# Patient Record
Sex: Male | Born: 2000
Health system: Southern US, Community
[De-identification: ages and names within clinical notes are randomized; demographics above are authoritative.]

## PROBLEM LIST (undated history)

## (undated) DIAGNOSIS — F909 Attention-deficit hyperactivity disorder, unspecified type: Secondary | ICD-10-CM

---
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
## 2018-01-20 ENCOUNTER — Other Ambulatory Visit: Payer: Self-pay | Admitting: Gerontology

## 2018-01-20 ENCOUNTER — Ambulatory Visit (INDEPENDENT_AMBULATORY_CARE_PROVIDER_SITE_OTHER): Payer: Self-pay

## 2018-01-20 DIAGNOSIS — Z0000 Encounter for general adult medical examination without abnormal findings: Secondary | ICD-10-CM

## 2018-02-11 ENCOUNTER — Other Ambulatory Visit: Payer: Self-pay | Admitting: Family Medicine

## 2018-02-11 DIAGNOSIS — R1011 Right upper quadrant pain: Secondary | ICD-10-CM

## 2018-02-11 DIAGNOSIS — R945 Abnormal results of liver function studies: Secondary | ICD-10-CM

## 2018-02-11 DIAGNOSIS — R7989 Other specified abnormal findings of blood chemistry: Secondary | ICD-10-CM

## 2018-02-16 ENCOUNTER — Ambulatory Visit
Admission: RE | Admit: 2018-02-16 | Discharge: 2018-02-16 | Disposition: A | Discharge status: Home / Self Care | Payer: 59 | Source: Ambulatory Visit | Attending: Family Medicine | Admitting: Family Medicine

## 2018-02-16 DIAGNOSIS — R945 Abnormal results of liver function studies: Secondary | ICD-10-CM

## 2018-02-16 DIAGNOSIS — R1011 Right upper quadrant pain: Secondary | ICD-10-CM

## 2018-02-16 DIAGNOSIS — R7989 Other specified abnormal findings of blood chemistry: Secondary | ICD-10-CM

## 2018-03-15 ENCOUNTER — Other Ambulatory Visit: Payer: Self-pay | Admitting: Physician Assistant

## 2018-03-15 DIAGNOSIS — R7989 Other specified abnormal findings of blood chemistry: Secondary | ICD-10-CM

## 2018-03-15 DIAGNOSIS — R945 Abnormal results of liver function studies: Principal | ICD-10-CM

## 2018-03-15 DIAGNOSIS — R9389 Abnormal findings on diagnostic imaging of other specified body structures: Secondary | ICD-10-CM

## 2018-03-21 ENCOUNTER — Ambulatory Visit
Admission: RE | Admit: 2018-03-21 | Discharge: 2018-03-21 | Disposition: A | Discharge status: Home / Self Care | Payer: 59 | Source: Ambulatory Visit | Attending: Physician Assistant | Admitting: Physician Assistant

## 2018-03-21 DIAGNOSIS — R945 Abnormal results of liver function studies: Principal | ICD-10-CM

## 2018-03-21 DIAGNOSIS — R7989 Other specified abnormal findings of blood chemistry: Secondary | ICD-10-CM

## 2018-03-21 DIAGNOSIS — R9389 Abnormal findings on diagnostic imaging of other specified body structures: Secondary | ICD-10-CM

## 2018-03-21 MED ORDER — IOPAMIDOL (ISOVUE-300) INJECTION 61%
100.0000 mL | Freq: Once | INTRAVENOUS | Status: AC | PRN
  Administered 2018-03-21: 100 mL via INTRAVENOUS

## 2018-09-16 ENCOUNTER — Emergency Department (HOSPITAL_COMMUNITY)
Admission: EM | Admit: 2018-09-16 | Discharge: 2018-09-17 | Disposition: A | Discharge status: Home / Self Care | Payer: 59 | Attending: Emergency Medicine | Admitting: Emergency Medicine

## 2018-09-16 ENCOUNTER — Encounter (HOSPITAL_COMMUNITY): Payer: Self-pay | Admitting: Emergency Medicine

## 2018-09-16 DIAGNOSIS — F909 Attention-deficit hyperactivity disorder, unspecified type: Secondary | ICD-10-CM | POA: Diagnosis not present

## 2018-09-16 DIAGNOSIS — Z87891 Personal history of nicotine dependence: Secondary | ICD-10-CM | POA: Diagnosis not present

## 2018-09-16 DIAGNOSIS — R109 Unspecified abdominal pain: Secondary | ICD-10-CM | POA: Diagnosis present

## 2018-09-16 DIAGNOSIS — K625 Hemorrhage of anus and rectum: Secondary | ICD-10-CM | POA: Diagnosis not present

## 2018-09-16 LAB — CBC
HCT: 43.5 % (ref 39.0–52.0)
Hemoglobin: 14.7 g/dL (ref 13.0–17.0)
MCH: 30.5 pg (ref 26.0–34.0)
MCHC: 33.8 g/dL (ref 30.0–36.0)
MCV: 90.2 fL (ref 80.0–100.0)
Platelets: 327 10*3/uL (ref 150–400)
RBC: 4.82 MIL/uL (ref 4.22–5.81)
RDW: 12.2 % (ref 11.5–15.5)
WBC: 8 10*3/uL (ref 4.0–10.5)
nRBC: 0 % (ref 0.0–0.2)

## 2018-09-16 LAB — COMPREHENSIVE METABOLIC PANEL
ALT: 19 U/L (ref 0–44)
AST: 20 U/L (ref 15–41)
Albumin: 4.7 g/dL (ref 3.5–5.0)
Alkaline Phosphatase: 98 U/L (ref 38–126)
Anion gap: 12 (ref 5–15)
BUN: 11 mg/dL (ref 6–20)
CO2: 24 mmol/L (ref 22–32)
Calcium: 9.4 mg/dL (ref 8.9–10.3)
Chloride: 100 mmol/L (ref 98–111)
Creatinine, Ser: 1.02 mg/dL (ref 0.61–1.24)
GFR calc Af Amer: >60 mL/min (ref >60)
GFR calc non Af Amer: >60 mL/min (ref >60)
Glucose, Bld: 96 mg/dL (ref 70–99)
Potassium: 3.4 mmol/L — ABNORMAL LOW (ref 3.5–5.1)
Sodium: 136 mmol/L (ref 135–145)
Total Bilirubin: 1 mg/dL (ref 0.3–1.2)
Total Protein: 7.2 g/dL (ref 6.5–8.1)

## 2018-09-16 LAB — TYPE AND SCREEN
ABO/RH(D): O POS
Antibody Screen: NEGATIVE

## 2018-09-16 LAB — ABO/RH: ABO/RH(D): O POS

## 2018-09-16 NOTE — ED Notes (Signed)
Pt in POV, reports rectal bleeding since yesterday with gen abd pain. No hx of same.

## 2018-09-17 ENCOUNTER — Emergency Department (HOSPITAL_COMMUNITY): Payer: 59

## 2018-09-17 ENCOUNTER — Other Ambulatory Visit: Payer: Self-pay

## 2018-09-17 MED ORDER — IOHEXOL 300 MG/ML  SOLN
100.0000 mL | Freq: Once | INTRAMUSCULAR | Status: AC | PRN
  Administered 2018-09-17: 06:00:00 100 mL via INTRAVENOUS

## 2018-09-17 NOTE — ED Provider Notes (Signed)
Emergency Department Provider Note   I have reviewed the triage vital signs and the nursing notes.   HISTORY  Chief Complaint Rectal Bleeding   HPI Russell Walker is a 18 y.o. male without significant past medical history who presents the emergency department today with abdominal pain and bloody stools.  Patient states that he has some type of liver abnormality they have been working up at [facility] but could not find a cause so told to go to the hospital.  Patient is not as worried about that but states that for the last 3 or 4 days he has had diffuse abdominal pain with dark blood and bright red blood in his stools multiple times.  States that seems to be in the feces itself and does not seem to be on the outside.  Has never anything like this before.  No known hemorrhoids.  No fevers.  No other associated or modifying symptoms.    Past Medical History:  Diagnosis Date   ADHD     There are no active problems to display for this patient.   History reviewed. No pertinent surgical history.    Allergies Patient has no known allergies.  No family history on file.  Social History Social History   Tobacco Use   Smoking status: Former Smoker   Smokeless tobacco: Never Used  Substance Use Topics   Alcohol use: Not Currently   Drug use: Yes    Types: Marijuana    Review of Systems  All other systems negative except as documented in the HPI. All pertinent positives and negatives as reviewed in the HPI. ____________________________________________   PHYSICAL EXAM:  VITAL SIGNS: ED Triage Vitals  Enc Vitals Group     BP 09/16/18 2214 129/66     Pulse Rate 09/16/18 2214 97     Resp 09/16/18 2214 18     Temp 09/16/18 2214 97.7 F (36.5 C)     Temp Source 09/16/18 2214 Oral     SpO2 09/16/18 2214 97 %     Weight 09/16/18 2214 170 lb (77.1 kg)     Height 09/16/18 2214 6' (1.829 m)    Constitutional: Alert and oriented. Well appearing and in no acute  distress. Eyes: Conjunctivae are normal. PERRL. EOMI. Head: Atraumatic. Nose: No congestion/rhinnorhea. Mouth/Throat: Mucous membranes are moist.  Oropharynx non-erythematous. Neck: No stridor.  No meningeal signs.   Cardiovascular: Normal rate, regular rhythm. Good peripheral circulation. Grossly normal heart sounds.   Respiratory: Normal respiratory effort.  No retractions. Lungs CTAB. Gastrointestinal: Soft and nontender. No distention.  Musculoskeletal: No lower extremity tenderness nor edema. No gross deformities of extremities. Neurologic:  Normal speech and language. No gross focal neurologic deficits are appreciated.  Skin:  Skin is warm, dry and intact. No rash noted.   ____________________________________________   LABS (all labs ordered are listed, but only abnormal results are displayed)  Labs Reviewed  COMPREHENSIVE METABOLIC PANEL - Abnormal; Notable for the following components:      Result Value   Potassium 3.4 (*)    All other components within normal limits  CBC  POC OCCULT BLOOD, ED  TYPE AND SCREEN  ABO/RH   ____________________________________________  EKG   EKG Interpretation  Date/Time:    Ventricular Rate:    PR Interval:    QRS Duration:   QT Interval:    QTC Calculation:   R Axis:     Text Interpretation:         ____________________________________________  RADIOLOGY  Ct Abdomen Pelvis W Contrast  Result Date: 09/17/2018 CLINICAL DATA:  18 year old male with history of persistent abdominal pain for the past 10 months. Bloody stools for the past 3 days. EXAM: CT ABDOMEN AND PELVIS WITH CONTRAST TECHNIQUE: Multidetector CT imaging of the abdomen and pelvis was performed using the standard protocol following bolus administration of intravenous contrast. CONTRAST:  100mL OMNIPAQUE IOHEXOL 300 MG/ML  SOLN COMPARISON:  CT the abdomen and pelvis 03/21/2018. FINDINGS: Lower chest: Unremarkable. Hepatobiliary: No suspicious cystic or solid  hepatic lesions. No intra or extrahepatic biliary ductal dilatation. Gallbladder is normal in appearance. Pancreas: No pancreatic mass. No pancreatic ductal dilatation. No pancreatic or peripancreatic fluid or inflammatory changes. Spleen: Unremarkable. Adrenals/Urinary Tract: Right kidney is not visualized, presumably congenitally absent. Left kidney and bilateral adrenal glands are normal in appearance. No hydroureteronephrosis. Urinary bladder is normal in appearance. Stomach/Bowel: Normal appearance of the stomach. No pathologic dilatation of small bowel or colon. The appendix is not confidently identified and may be surgically absent. Regardless, there are no inflammatory changes noted adjacent to the cecum to suggest the presence of an acute appendicitis at this time. Vascular/Lymphatic: No significant atherosclerotic disease, aneurysm or dissection noted in the abdominal or pelvic vasculature. Left renal vein again appears mildly compressed between the superior mesenteric artery and the abdominal aorta, associated with mild dilatation of the proximal left renal vein, as well as the presence of venous collaterals extending into the left lumbar region, similar to the prior examination. No lymphadenopathy noted in the abdomen or pelvis. Reproductive: Prostate gland and seminal vesicles are unremarkable in appearance. Other: No significant volume of ascites.  No pneumoperitoneum. Musculoskeletal: There are no aggressive appearing lytic or blastic lesions noted in the visualized portions of the skeleton. IMPRESSION: 1. No acute findings are noted in the abdomen or pelvis to account for the patient's symptoms. 2. Congenital absence of the right kidney. Solitary left kidney, with findings again suggestive of Nutcracker syndrome, as detailed above. Electronically Signed   By: Trudie Reedaniel  Entrikin M.D.   On: 09/17/2018 07:00    ____________________________________________   PROCEDURES  Procedure(s) performed:    Procedures   ____________________________________________   INITIAL IMPRESSION / ASSESSMENT AND PLAN / ED COURSE  Will eval for IBD. GI follow up if negative.    Workup negative. Stable VS. Normal Hb, no indication for admission or further workup at this time.  Pertinent labs & imaging results that were available during my care of the patient were reviewed by me and considered in my medical decision making (see chart for details).   A medical screening exam was performed and I feel the patient has had an appropriate workup for their chief complaint at this time and likelihood of emergent condition existing is low. They have been counseled on decision, discharge, follow up and which symptoms necessitate immediate return to the emergency department. They or their family verbally stated understanding and agreement with plan and discharged in stable condition.   ____________________________________________  FINAL CLINICAL IMPRESSION(S) / ED DIAGNOSES  Final diagnoses:  Rectal bleeding     MEDICATIONS GIVEN DURING THIS VISIT:  Medications  iohexol (OMNIPAQUE) 300 MG/ML solution 100 mL (100 mLs Intravenous Contrast Given 09/17/18 0617)     NEW OUTPATIENT MEDICATIONS STARTED DURING THIS VISIT:  New Prescriptions   No medications on file    Note:  This note was prepared with assistance of Dragon voice recognition software. Occasional wrong-word or sound-a-like substitutions may have occurred due to the inherent limitations of voice  recognition software.   Jennica Tagliaferri, Barbara Cower, MD 09/17/18 [phone]

## 2018-09-17 NOTE — ED Notes (Signed)
Patient transported to CT 

## 2018-12-21 IMAGING — US US ABDOMEN COMPLETE
1 series · 13 of 25 positions shown · non-contrast
Comparison: CT 10/29/2017.

CLINICAL DATA: Right upper quadrant and mid abdominal pain.
Prominent liver. Elevated LFTs. Recent falls. Solitary left kidney.

EXAM:
ABDOMEN ULTRASOUND COMPLETE

[Series 1: us abdomen complete · 0.14mm/px · 13 of 76 slices shown]
[im 1/76]
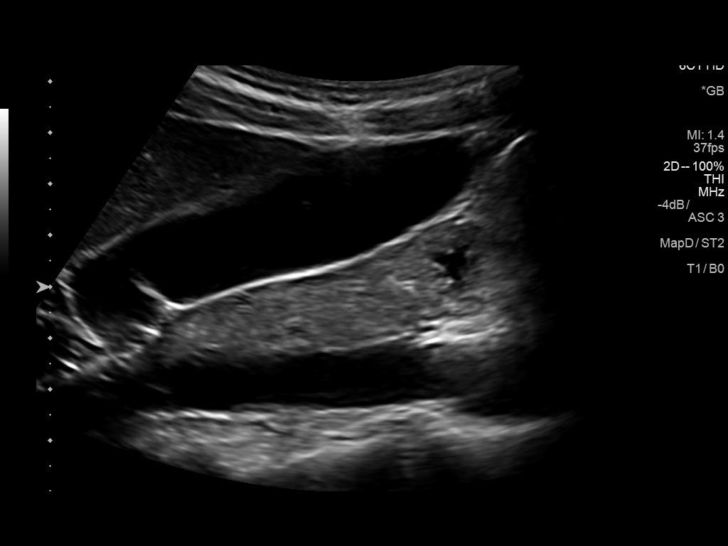
[im 7/76]
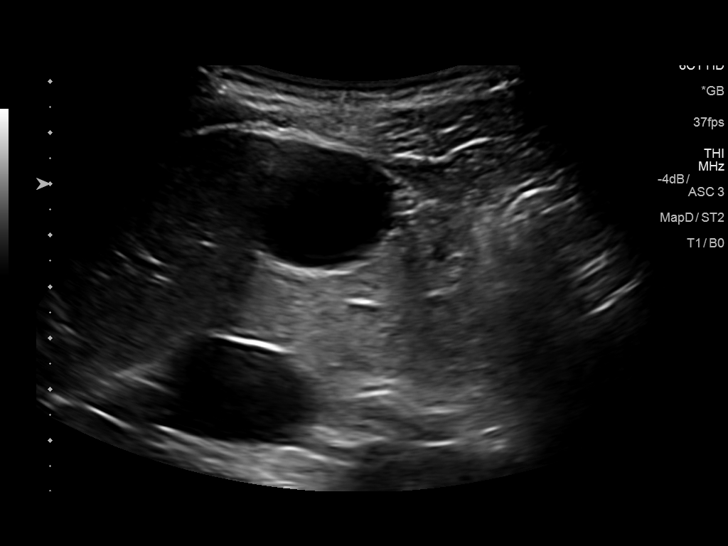
[im 13/76]
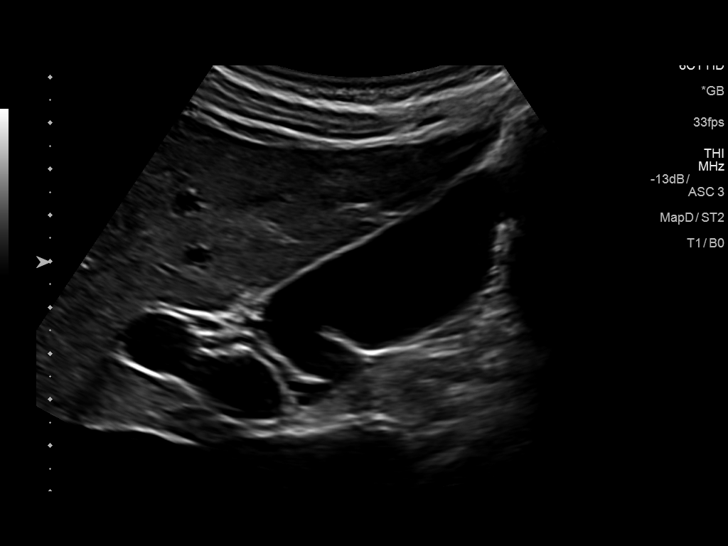
[im 19/76]
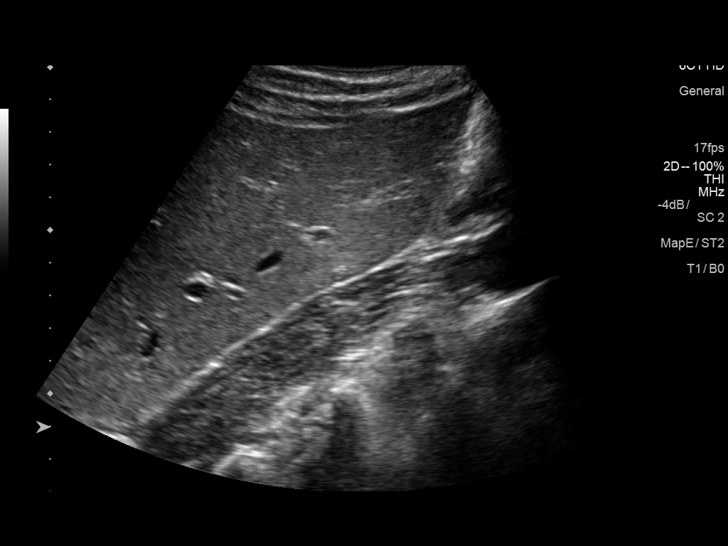
[im 26/76]
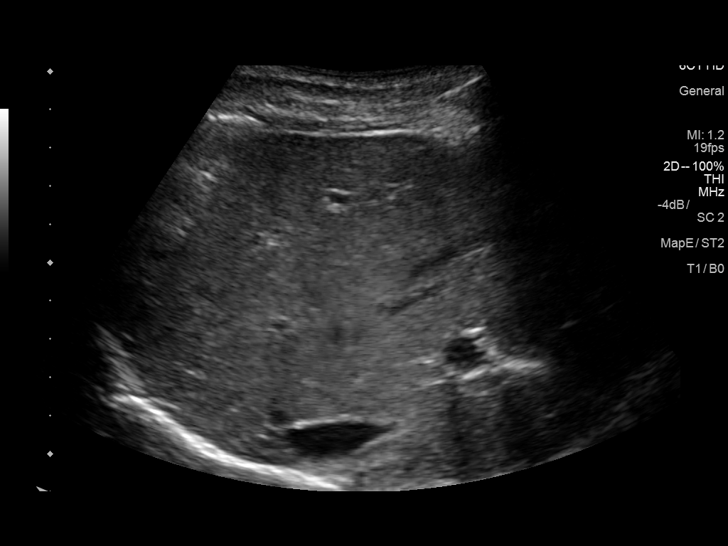
[im 32/76]
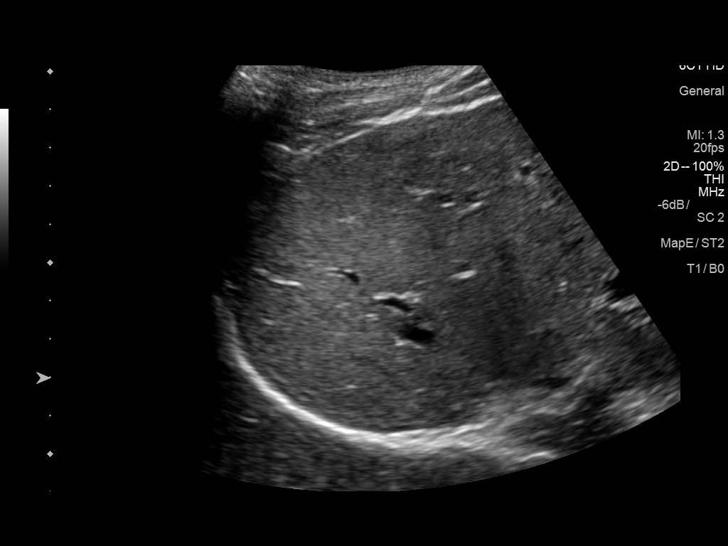
[im 38/76]
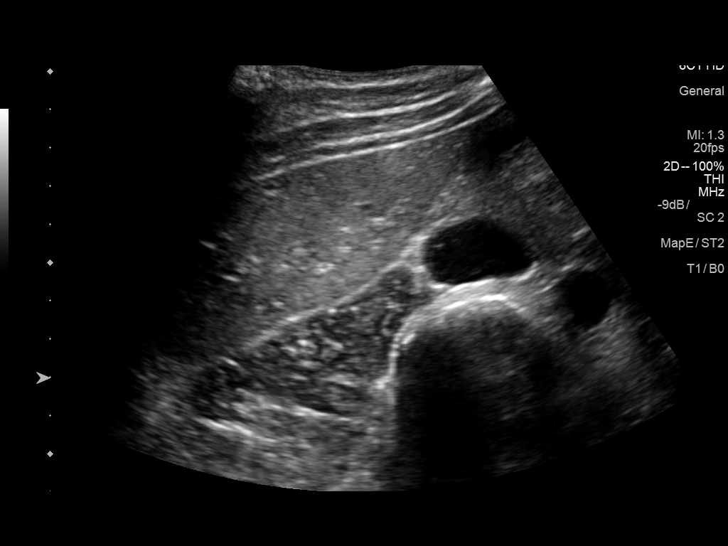
[im 44/76]
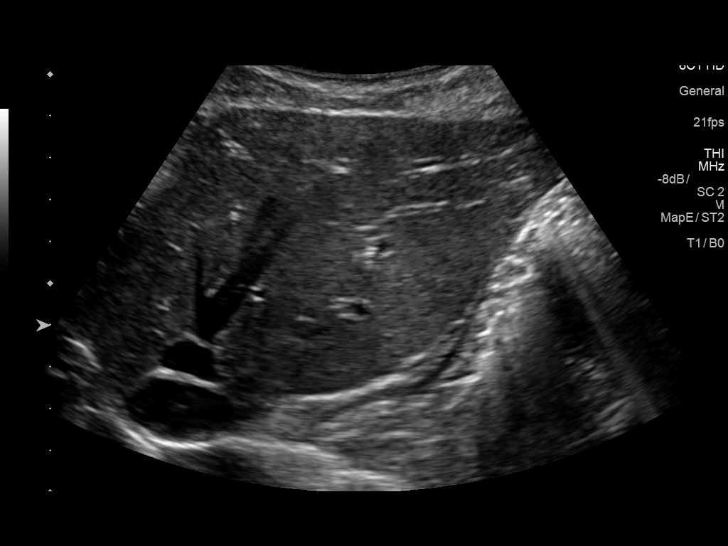
[im 51/76]
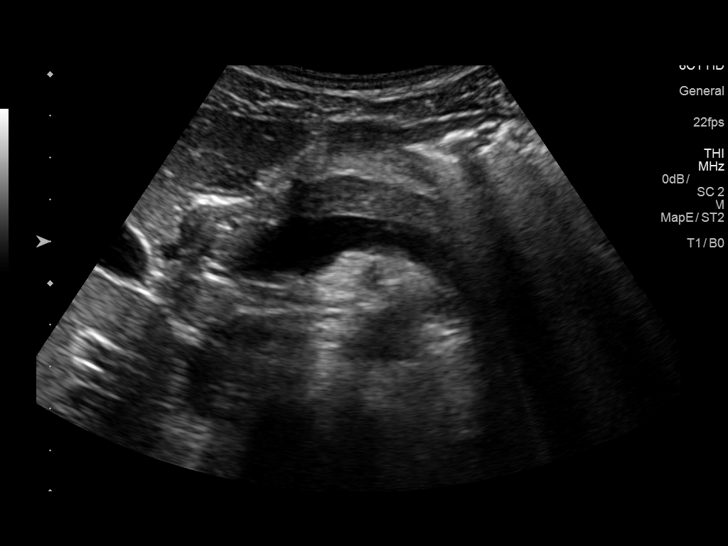
[im 57/76]
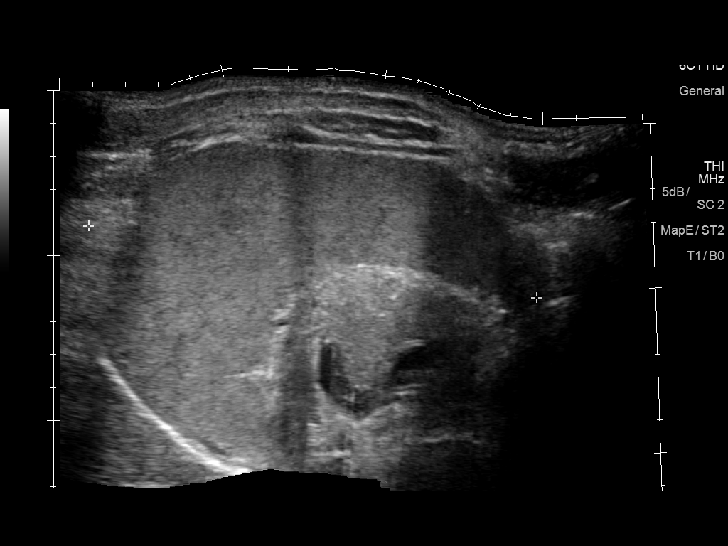
[im 63/76]
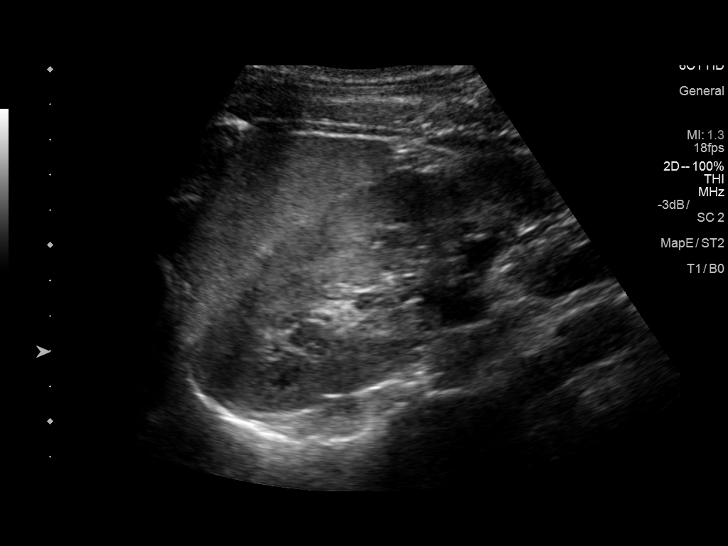
[im 69/76]
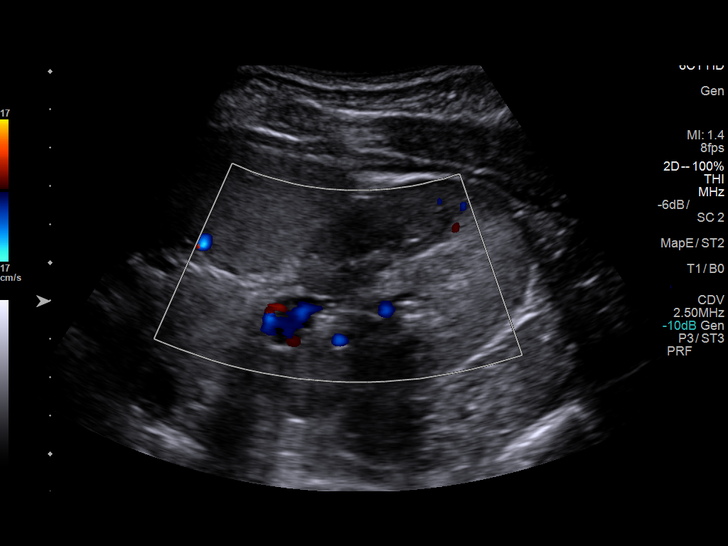
[im 76/76]
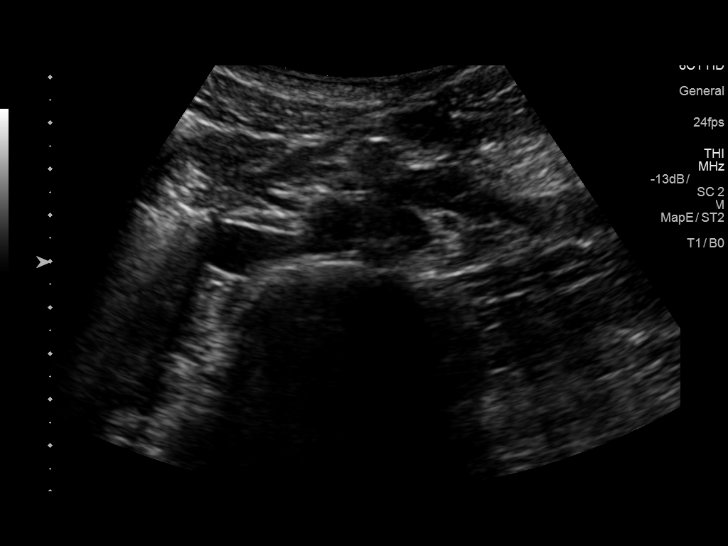

[13 of 25 positions shown; findings below may reference images not displayed]

FINDINGS: Gallbladder: No gallstones or wall thickening visualized. No
sonographic Murphy sign noted by sonographer.

Common bile duct: Diameter: 2.5 mm

Liver: Liver is prominent and measures 16.0 cm. Hepatic echotexture
is normal. No focal hepatic abnormality identified. No portal vein
is patent on color Doppler imaging with normal direction of blood
flow towards the liver.

IVC: No abnormality visualized.

Pancreas: Visualized portion unremarkable.

Spleen: Spleen measures 13 scratched it spleen is prominent at
cm with a volume of 537 cc. No splenic mass noted.

Right Kidney: Length: Absent right kidney.

Left Kidney: Length: 14.4 cm. Echogenicity within normal limits. No
mass or hydronephrosis visualized.

Abdominal aorta: No aneurysm visualized.

Other findings: None.
IMPRESSION: 1. Liver is prominent at 16.0 cm. Hepatic echotexture is normal. No
focal hepatic mass. No gallstones or biliary distention.

2.  Spleen is prominent at 13.5 cm with a volume of 537 cc.

3.  Solitary left kidney again noted.

## 2019-05-23 ENCOUNTER — Emergency Department (HOSPITAL_COMMUNITY)
Admission: EM | Admit: 2019-05-23 | Discharge: 2019-05-23 | Disposition: A | Discharge status: Home / Self Care | Payer: 59 | Attending: Emergency Medicine | Admitting: Emergency Medicine

## 2019-05-23 ENCOUNTER — Telehealth (HOSPITAL_COMMUNITY): Payer: Self-pay | Admitting: Professional

## 2019-05-23 ENCOUNTER — Encounter (HOSPITAL_COMMUNITY): Payer: Self-pay

## 2019-05-23 ENCOUNTER — Other Ambulatory Visit: Payer: Self-pay

## 2019-05-23 DIAGNOSIS — F322 Major depressive disorder, single episode, severe without psychotic features: Secondary | ICD-10-CM | POA: Insufficient documentation

## 2019-05-23 DIAGNOSIS — T50904A Poisoning by unspecified drugs, medicaments and biological substances, undetermined, initial encounter: Secondary | ICD-10-CM

## 2019-05-23 DIAGNOSIS — R45851 Suicidal ideations: Secondary | ICD-10-CM | POA: Insufficient documentation

## 2019-05-23 DIAGNOSIS — F341 Dysthymic disorder: Secondary | ICD-10-CM | POA: Diagnosis present

## 2019-05-23 DIAGNOSIS — T38894A Poisoning by other hormones and synthetic substitutes, undetermined, initial encounter: Secondary | ICD-10-CM | POA: Diagnosis present

## 2019-05-23 DIAGNOSIS — F339 Major depressive disorder, recurrent, unspecified: Secondary | ICD-10-CM

## 2019-05-23 DIAGNOSIS — Z87891 Personal history of nicotine dependence: Secondary | ICD-10-CM | POA: Diagnosis not present

## 2019-05-23 LAB — COMPREHENSIVE METABOLIC PANEL
ALT: 13 U/L (ref 0–44)
AST: 14 U/L — ABNORMAL LOW (ref 15–41)
Albumin: 4.8 g/dL (ref 3.5–5.0)
Alkaline Phosphatase: 67 U/L (ref 38–126)
Anion gap: 9 (ref 5–15)
BUN: 12 mg/dL (ref 6–20)
CO2: 28 mmol/L (ref 22–32)
Calcium: 9.4 mg/dL (ref 8.9–10.3)
Chloride: 105 mmol/L (ref 98–111)
Creatinine, Ser: 1.02 mg/dL (ref 0.61–1.24)
GFR calc Af Amer: >60 mL/min (ref >60)
GFR calc non Af Amer: >60 mL/min (ref >60)
Glucose, Bld: 119 mg/dL — ABNORMAL HIGH (ref 70–99)
Potassium: 3.5 mmol/L (ref 3.5–5.1)
Sodium: 142 mmol/L (ref 135–145)
Total Bilirubin: 0.7 mg/dL (ref 0.3–1.2)
Total Protein: 7.2 g/dL (ref 6.5–8.1)

## 2019-05-23 LAB — RAPID URINE DRUG SCREEN, HOSP PERFORMED
Amphetamines: NOT DETECTED
Barbiturates: NOT DETECTED
Benzodiazepines: NOT DETECTED
Cocaine: NOT DETECTED
Opiates: NOT DETECTED
Tetrahydrocannabinol: NOT DETECTED

## 2019-05-23 LAB — CBC
HCT: 43.4 % (ref 39.0–52.0)
Hemoglobin: 14.2 g/dL (ref 13.0–17.0)
MCH: 30 pg (ref 26.0–34.0)
MCHC: 32.7 g/dL (ref 30.0–36.0)
MCV: 91.6 fL (ref 80.0–100.0)
Platelets: 329 10*3/uL (ref 150–400)
RBC: 4.74 MIL/uL (ref 4.22–5.81)
RDW: 11.9 % (ref 11.5–15.5)
WBC: 6.4 10*3/uL (ref 4.0–10.5)
nRBC: 0 % (ref 0.0–0.2)

## 2019-05-23 LAB — ETHANOL: Alcohol, Ethyl (B): <10 mg/dL (ref <10)

## 2019-05-23 LAB — ACETAMINOPHEN LEVEL: Acetaminophen (Tylenol), Serum: <10 ug/mL — ABNORMAL LOW (ref 10–30)

## 2019-05-23 LAB — SALICYLATE LEVEL: Salicylate Lvl: <7 mg/dL — ABNORMAL LOW (ref 7.0–30.0)
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
## 2019-05-23 NOTE — [facility] Suicide Risk Assessment (Cosign Needed)
Suicide Risk Assessment  Discharge Assessment   [facility] Discharge Suicide Risk Assessment   Principal Problem: Dysthymia Discharge Diagnoses: Principal Problem:   Dysthymia   Total Time spent with patient: 30 minutes  Musculoskeletal: Strength & Muscle Tone: within normal limits Gait & Station: normal Patient leans: N/A  Psychiatric Specialty Exam:   Blood pressure 125/88, pulse 75, temperature 98.2 F (36.8 C), temperature source Oral, resp. rate 18, height 6' (1.829 m), weight 70.3 kg, SpO2 98 %.Body mass index is 21.02 kg/m.  General Appearance: Casual  Eye Contact::  Good  Speech:  Clear and Coherent and Normal Rate409  Volume:  Normal  Mood:  "good.  Only bad mood when mother is around.  She is toxic"    Affect:  Appropriate and Congruent  Thought Process:  Coherent, Goal Directed and Descriptions of Associations: Intact  Orientation:  Full (Time, Place, and Person)  Thought Content:  WDL  Suicidal Thoughts:  No  Homicidal Thoughts:  No  Memory:  Immediate;   Good Recent;   Good Remote;   Good  Judgement:  Intact  Insight:  Present  Psychomotor Activity:  Normal  Concentration:  Good  Recall:  Good  Fund of Knowledge:Good  Language: Good  Akathisia:  No  Handed:  Right  AIMS (if indicated):     Assets:  Communication Skills Desire for Improvement Housing Physical Health Social Support  Sleep:     Cognition: WNL  ADL's:  Intact   Mental Status Per Nursing Assessment::   On Admission:     Russell Walker, 19 y.o., male patient seen via tele psych by this provider, Dr. Lucianne Muss; and chart reviewed on 05/23/19.  On evaluation Russell Walker reports he is only in the hospital because he was IVC by his mother.  Patient chronic depression and has had suicidal thoughts but would not try to kill himself.  Patient states that he takes melatonin 20 to 50 mg every night "I had taken some earlier and couldn't fall to sleep so I had taken more.  I was sleep when the police  came."  Patient denies suicidal/self-harm/homicidal ideation, psychosis, and paranoia.   During evaluation Russell Walker is alert/oriented x 4; calm/cooperative; and mood is congruent with affect.  He does not appear to be responding to internal/external stimuli or delusional thoughts.  Patient denies suicidal/self-harm/homicidal ideation, psychosis, and paranoia.  Patient answered question appropriately.  Collateral information gathered from mother of patient (see TTS assessment note)  Feels that patient safe to come home but would like him to have outpatient psychiatric services    Demographic Factors:  Male and Caucasian  Loss Factors: Financial problems/change in socioeconomic status  Historical Factors: NA  Risk Reduction Factors:   Religious beliefs about death, Living with another person, especially a relative and Positive social support  Continued Clinical Symptoms:  Dysthymia  Cognitive Features That Contribute To Risk:  None    Suicide Risk:  Minimal: No identifiable suicidal ideation.  Patients presenting with no risk factors but with morbid ruminations; may be classified as minimal risk based on the severity of the depressive symptoms    Plan Of Care/Follow-up recommendations:  Activity:  As tolerated Diet:  Heart healthy      Discharge Instructions     For your behavioral health needs you are advised to follow up with an outpatient psychiatrist.  Contact one of the following providers at your earliest opportunity to ask about scheduling an intake appointment:  [facility] [place], [place] [address]      [phone]       [address] [place], [place] [address]      [phone]       Leanord Hawking, MD      [facility]      [address], [place] [address]      [phone]     Disposition:  Patient psychiatrically cleared No evidence of imminent risk to self or others at present.   Patient does not meet criteria for psychiatric inpatient admission. Supportive therapy provided about ongoing stressors. Discussed crisis plan, support from social network, calling 911, coming to the Emergency Department, and calling Suicide Hotline.  Russell Train, NP 05/23/2019, 10:18 AM

## 2019-05-23 NOTE — Discharge Instructions (Signed)
For your behavioral health needs you are advised to follow up with an outpatient psychiatrist.  Contact one of the following providers at your earliest opportunity to ask about scheduling an intake appointment:       [facility]      [address]      [place], [place] [address]      [phone]       [facility]      [address]      [place], [place] [address]      [phone]       Neila Gear, MD      [facility]      [address]      [place], [place] [address]      [phone]
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
## 2019-05-23 NOTE — ED Triage Notes (Signed)
Patient arrived stating he took 100mg  of melatonin. States someone called for a wellness check due to daily suicidal thoughts over the last few years. Reports HI in the past but SI today. Plan to overdose off sleeping pills so he could go to sleep and not wake back up.

## 2019-05-23 NOTE — ED Provider Notes (Signed)
19 year old male history of depression and suicidal presents today stating that he felt suicidal last night due to interactions an argument with his mother.  He took 2 handfuls of thiamine.  He denies any other ingestion or suicidal gestures.  He reports that he called the suicide hotline.  He is here because he hung up on them and the sheriff showed up at his door and brought him to the ED. Here he reports some depression but is not currently stating that he is suicidal.  He was evaluated for his overdose and poison control was contacted.  They advise EKG.  EKG appears normal.  Remainder of his labs appear normal.  Appears stable for psychiatric disposition.  He is asymptomatic for Covid and Covid test is currently pending. Vitals:   05/23/19 0800 05/23/19 0830  BP: 125/73 110/64  Pulse: 79 71  Resp: 13 16  Temp:    SpO2: 99% 99%     Well-developed well-nourished male single does not appear to be in any acute distress Heart is regular rate and rhythm Lungs are clear to auscultation Neurologically he is alert and oriented.  No focal deficits.     Margarita Grizzle, MD 05/23/19 [phone]

## 2019-05-23 NOTE — BH Assessment (Signed)
[facility] Assessment Progress Note  Per Shuvon Rankin, FNP, this pt does not require psychiatric hospitalization at this time.  Pt is to be discharged from [facility] with referral information for area psychiatrists.  This has been included in pt's discharge instructions.  Pt's nurse, Diane, has been notified.  Doylene Canning, MA Triage Specialist [phone]

## 2019-05-23 NOTE — ED Notes (Signed)
PT DISCHARGED. INSTRUCTIONS GIVEN. AAOX4. PT IN NO APPARENT DISTRESS OR PAIN. THE OPPORTUNITY TO ASK QUESTIONS WAS PROVIDED. 

## 2019-05-23 NOTE — ED Notes (Signed)
Pt advised the need for a urine sample.

## 2019-05-24 ENCOUNTER — Telehealth (HOSPITAL_COMMUNITY): Payer: Self-pay | Admitting: Professional

## 2020-05-10 IMAGING — DX DG CHEST 2V
2 series · 2 of 2 positions shown · non-contrast
Comparison: Chest CT October 29, 2017; chest radiograph October 29, 2017

CLINICAL DATA: Employer annual screening

EXAM:
CHEST - 2 VIEW

[chest pa]
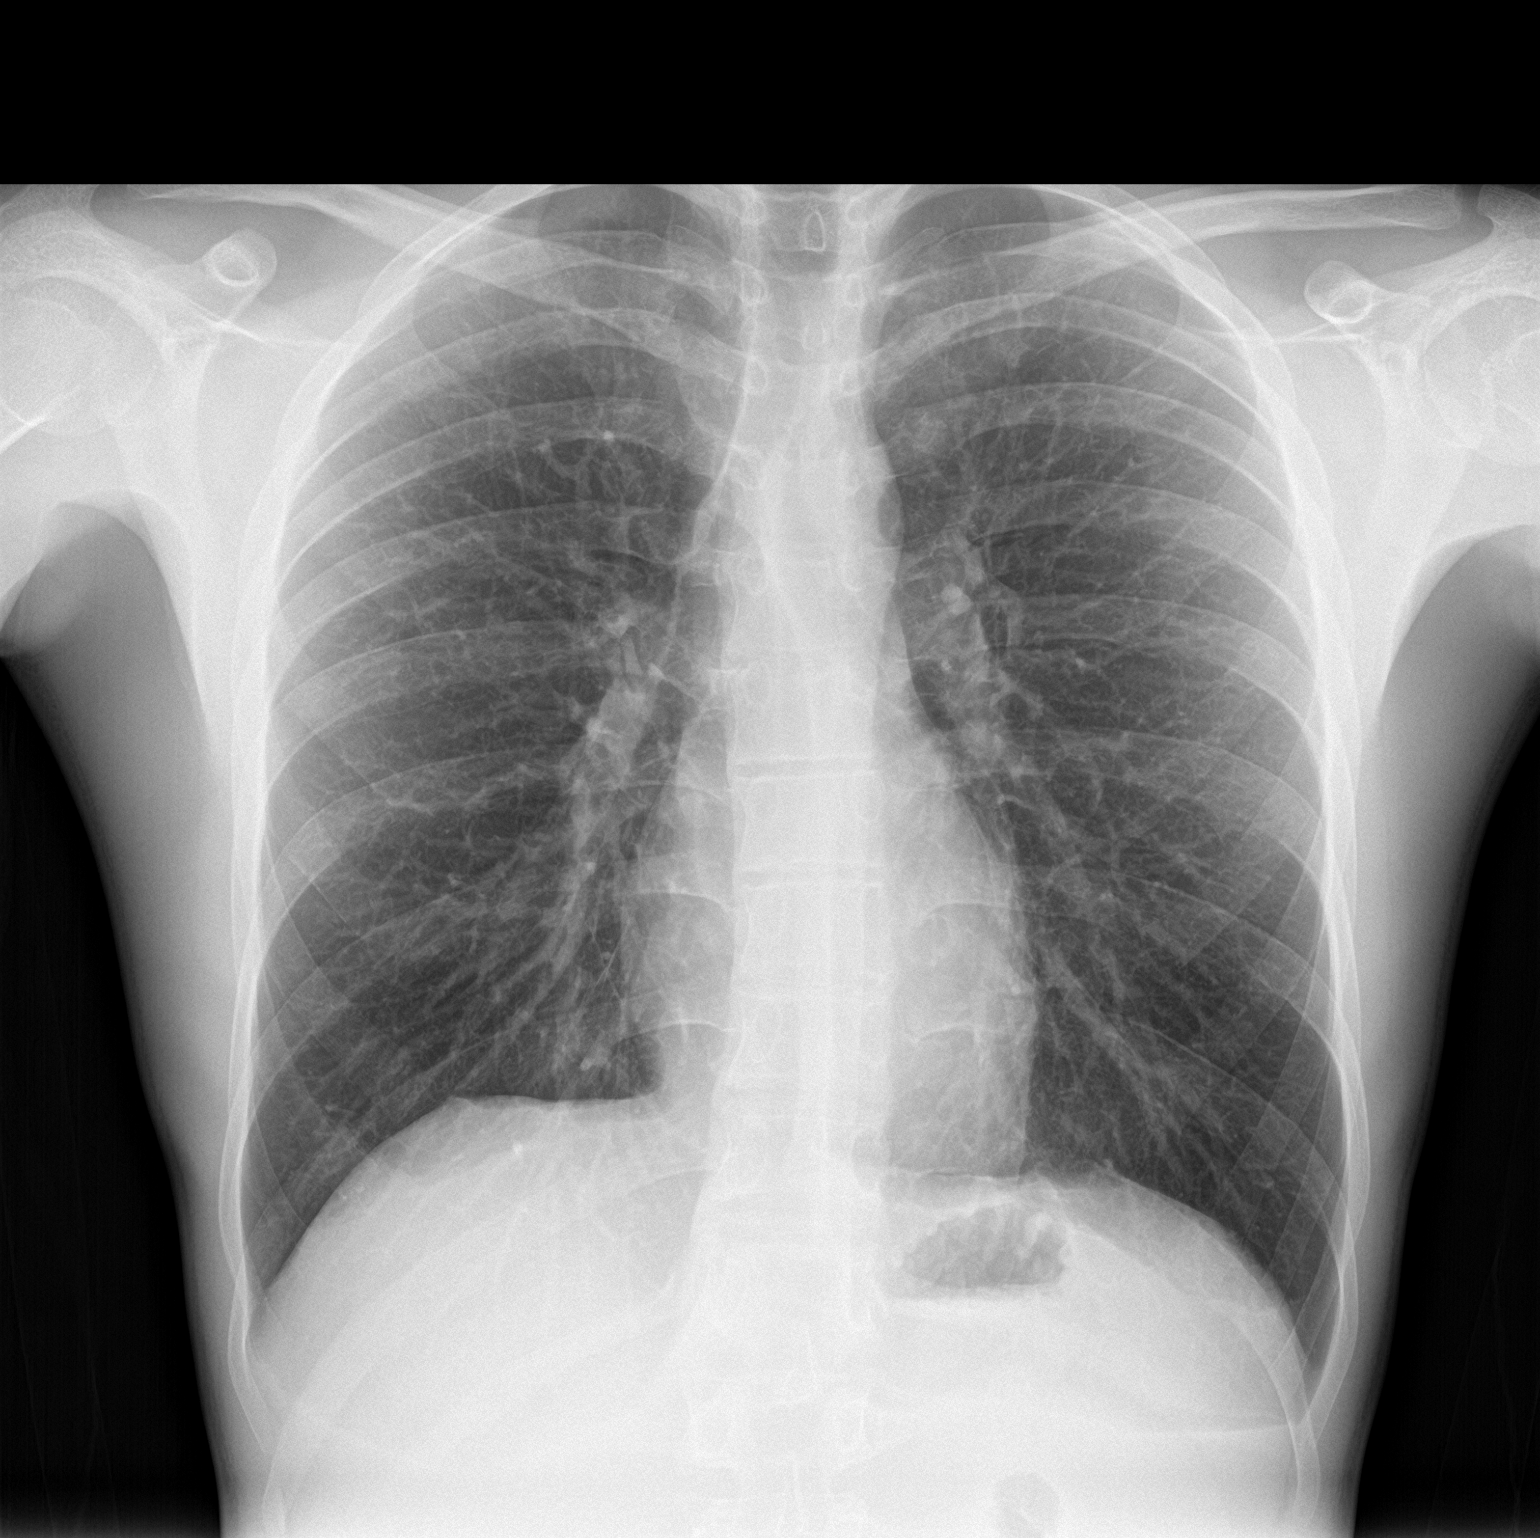

[chest lat]
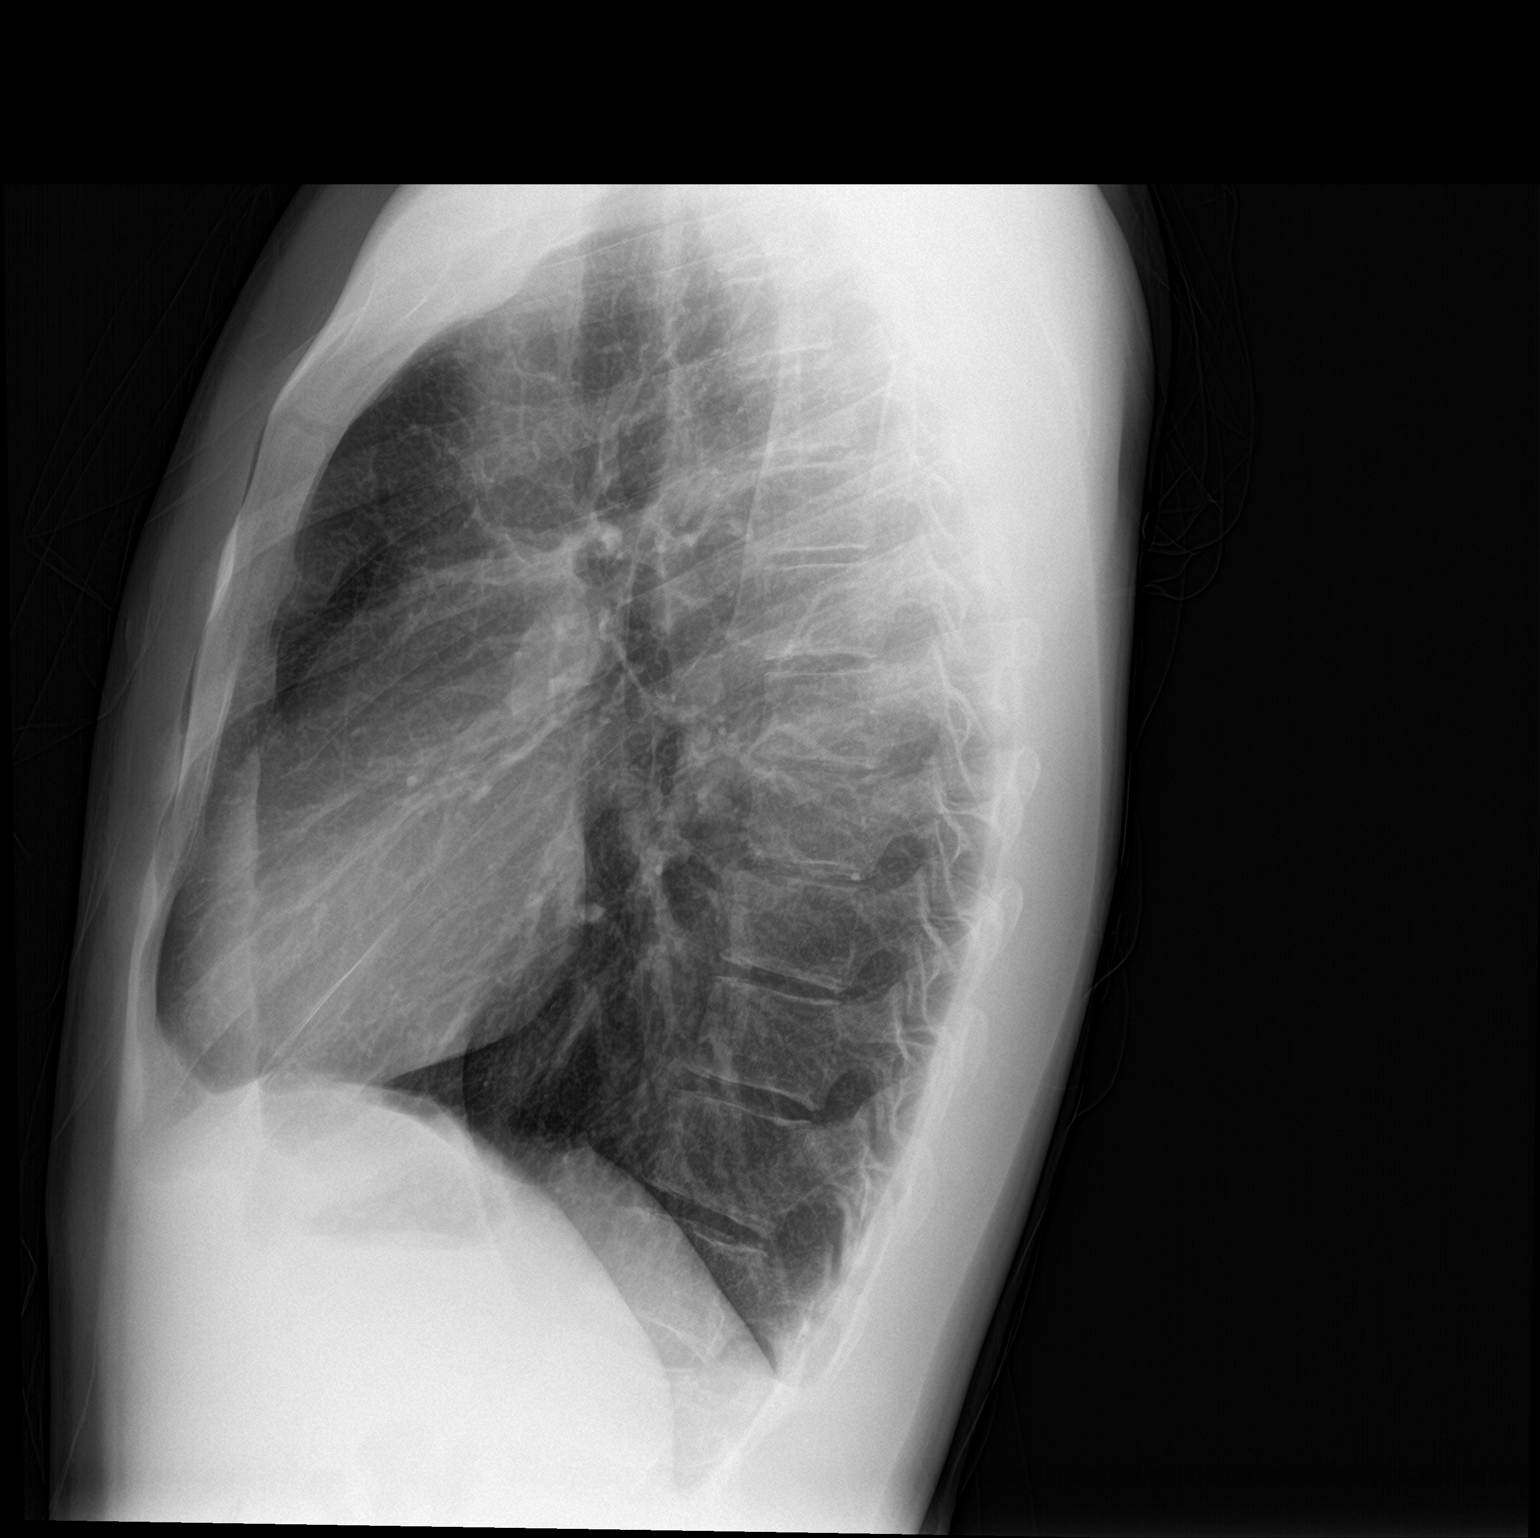

[2 of 2 positions shown; findings below may reference images not displayed]

FINDINGS: Lungs are clear. Heart size and pulmonary vascularity are normal. No
adenopathy. No bone lesions.
IMPRESSION: No abnormality noted.

## 2023-12-16 ENCOUNTER — Other Ambulatory Visit: Payer: Self-pay | Admitting: Surgery

## 2023-12-16 DIAGNOSIS — I871 Compression of vein: Secondary | ICD-10-CM

## 2024-01-17 ENCOUNTER — Ambulatory Visit: Attending: Surgery | Admitting: Surgery

## 2024-01-17 ENCOUNTER — Encounter: Payer: Self-pay | Admitting: Surgery

## 2024-01-17 ENCOUNTER — Ambulatory Visit (HOSPITAL_COMMUNITY)
Admission: RE | Admit: 2024-01-17 | Discharge: 2024-01-17 | Disposition: A | Discharge status: Home / Self Care | Source: Ambulatory Visit | Attending: Surgery | Admitting: Surgery

## 2024-01-17 ENCOUNTER — Other Ambulatory Visit: Payer: Self-pay

## 2024-01-17 VITALS — BP 118/66 | HR 69 | Temp 98.1°F | Ht 72.0 in | Wt 202.0 lb

## 2024-01-17 DIAGNOSIS — Q279 Congenital malformation of peripheral vascular system, unspecified: Secondary | ICD-10-CM | POA: Diagnosis not present

## 2024-01-17 DIAGNOSIS — I871 Compression of vein: Secondary | ICD-10-CM | POA: Insufficient documentation

## 2024-01-17 NOTE — Addendum Note (Signed)
 Addended by: RAYNA MOATS A on: 01/17/2024 02:23 PM   Modules accepted: Orders

## 2024-01-17 NOTE — Addendum Note (Signed)
 Addended by: RAYNA MOATS A on: 01/17/2024 02:15 PM   Modules accepted: Orders

## 2024-01-17 NOTE — Progress Notes (Signed)
 [facility] [place]  Patient name: Russell Walker MRN: [id number] DOB: 04/06/2001 Sex: male   REQUESTING PROVIDER:    Mliss Chill   REASON FOR CONSULT:    Nutcracker syndrome  HISTORY OF PRESENT ILLNESS:   Russell Walker is a 23 y.o. 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  The patient states that he has had abdominal pain in his left flank radiating around to his left back since middle school.  He describes this as a stabbing pain and continuous.  He also notes that he has had prior urinalysis that have been positive for blood.  He has not had any prior abdominal surgery.  He does note that there is a family history of pancreatic and renal cancer.  He is a non-smoker  PAST MEDICAL HISTORY    Past Medical History:  Diagnosis Date   ADHD      FAMILY HISTORY   History reviewed. No pertinent family history.  SOCIAL HISTORY:   Social History   Socioeconomic History   Marital status: Single    Spouse name: Not on file   Number of children: Not on file   Years of education: Not on file   Highest education level: Not on file  Occupational History   Not on file  Tobacco Use   Smoking status: Former   Smokeless tobacco: Never  Substance and Sexual Activity   Alcohol use: Not Currently   Drug use: Yes    Types: Marijuana    Comment: unable to assess amount used   Sexual activity: Not on file  Other Topics Concern   Not on file  Social History Narrative   Not on file   Social Drivers of Health   Financial Resource Strain: Not on file  Food Insecurity: Not on file  Transportation Needs: Not on file  Physical Activity: Not on file  Stress: Not on file  Social Connections: Not on file  Intimate Partner Violence: Not on file    ALLERGIES:    No Known Allergies  CURRENT MEDICATIONS:    No current outpatient medications on file.   No current  facility-administered medications for this visit.    REVIEW OF SYSTEMS:   [X]  denotes positive finding, [ ]  denotes negative finding Cardiac  Comments:  Chest pain or chest pressure:    Shortness of breath upon exertion:    Short of breath when lying flat:    Irregular heart rhythm:        Vascular    Pain in calf, thigh, or hip brought on by ambulation:    Pain in feet at night that wakes you up from your sleep:     Blood clot in your veins:    Leg swelling:         Pulmonary    Oxygen at home:    Productive cough:     Wheezing:         Neurologic    Sudden weakness in arms or legs:     Sudden numbness in arms or legs:     Sudden onset of difficulty speaking or slurred speech:    Temporary loss of vision in one eye:     Problems with dizziness:         Gastrointestinal    Blood in stool:      Vomited blood:         Genitourinary    Burning when urinating:  Blood in urine:        Psychiatric    Major depression:         Hematologic    Bleeding problems:    Problems with blood clotting too easily:        Skin    Rashes or ulcers:        Constitutional    Fever or chills:     PHYSICAL EXAM:   Vitals:   01/17/24 0825  BP: 118/66  Pulse: 69  Temp: 98.1 F (36.7 C)  SpO2: 96%  Weight: 202 lb (91.6 kg)  Height: 6' (1.829 m)    GENERAL: The patient is a well-nourished male, in no acute distress. The vital signs are documented above. CARDIAC: There is a regular rate and rhythm.  PULMONARY: Nonlabored respirations ABDOMEN: Soft and non-tender with normal pitched bowel sounds.  MUSCULOSKELETAL: There are no major deformities or cyanosis. NEUROLOGIC: No focal weakness or paresthesias are detected. SKIN: There are no ulcers or rashes noted. PSYCHIATRIC: The patient has a normal affect.  STUDIES:   I have reviewed the following CT scan: 1. No acute findings are noted in the abdomen or pelvis to account for the patient's symptoms. 2. Congenital  absence of the right kidney. Solitary left kidney, with findings again suggestive of Nutcracker syndrome, as detailed above.    ASSESSMENT and PLAN   Left renal vein nutcracker syndrome: I am sending the patient for a urinalysis to monitor him for blood.  He does not have any signs of renal insufficiency.  His creatinine is 1.0.  I suspect that his abdominal pain is related to this.  We discussed the possibility of surgical intervention.  He is interested in this.  I will have him return following his urinalysis to make further plans.   Malvina Serene CLORE, MD, FACS [facility] [phone] Pager [phone]

## 2024-01-18 LAB — UA/M W/RFLX CULTURE, ROUTINE
Bilirubin, UA: NEGATIVE
Glucose, UA: NEGATIVE
Ketones, UA: NEGATIVE
Leukocytes,UA: NEGATIVE
Nitrite, UA: NEGATIVE
Protein,UA: NEGATIVE
RBC, UA: NEGATIVE
Specific Gravity, UA: 1.019 (ref 1.005–1.030)
Urobilinogen, Ur: 0.2 mg/dL (ref 0.2–1.0)
pH, UA: 6.5 (ref 5.0–7.5)

## 2024-01-18 LAB — MICROSCOPIC EXAMINATION
Bacteria, UA: NONE SEEN
Casts: NONE SEEN /LPF
Epithelial Cells (non renal): NONE SEEN /HPF (ref 0–10)
WBC, UA: NONE SEEN /HPF (ref 0–5)

## 2024-01-31 ENCOUNTER — Ambulatory Visit: Attending: Surgery | Admitting: Surgery

## 2024-01-31 ENCOUNTER — Encounter: Payer: Self-pay | Admitting: Surgery

## 2024-01-31 VITALS — BP 116/65 | HR 71 | Ht 72.0 in | Wt 208.0 lb

## 2024-01-31 DIAGNOSIS — I871 Compression of vein: Secondary | ICD-10-CM

## 2024-01-31 NOTE — H&P (View-Only) (Signed)
 [facility] [place]  Patient name: Russell Walker MRN: [id number] DOB: 04/29/2000 Sex: male   REASON FOR VISIT:    Follow-up  HISOTRY OF PRESENT ILLNESS:   Russell Walker is a 23 y.o. 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  The patient states that he has had abdominal pain in his left flank radiating around to his left back since middle school.  He describes this as a stabbing pain and continuous.  He also notes that he has had prior urinalysis that have been positive for blood.  He has not had any prior abdominal surgery.  He does note that there is a family history of pancreatic and renal cancer.  He is a non-smoker.  I sent him for a urinalysis to look for blood.   PAST MEDICAL HISTORY:   Past Medical History:  Diagnosis Date   ADHD      FAMILY HISTORY:   History reviewed. No pertinent family history.  SOCIAL HISTORY:   Social History   Tobacco Use   Smoking status: Former   Smokeless tobacco: Never  Substance Use Topics   Alcohol use: Not Currently     ALLERGIES:   No Known Allergies   CURRENT MEDICATIONS:   No current outpatient medications on file.   No current facility-administered medications for this visit.    REVIEW OF SYSTEMS:   [X]  denotes positive finding, [ ]  denotes negative finding Cardiac  Comments:  Chest pain or chest pressure:    Shortness of breath upon exertion:    Short of breath when lying flat:    Irregular heart rhythm:        Vascular    Pain in calf, thigh, or hip brought on by ambulation:    Pain in feet at night that wakes you up from your sleep:     Blood clot in your veins:    Leg swelling:         Pulmonary    Oxygen at home:    Productive cough:     Wheezing:         Neurologic    Sudden weakness in arms or legs:     Sudden numbness in arms or legs:     Sudden onset of difficulty speaking or slurred speech:     Temporary loss of vision in one eye:     Problems with dizziness:         Gastrointestinal    Blood in stool:     Vomited blood:         Genitourinary    Burning when urinating:     Blood in urine:        Psychiatric    Major depression:         Hematologic    Bleeding problems:    Problems with blood clotting too easily:        Skin    Rashes or ulcers:        Constitutional    Fever or chills:      PHYSICAL EXAM:   Vitals:   01/31/24 1054  BP: 116/65  Pulse: 71  SpO2: 96%  Weight: 208 lb (94.3 kg)  Height: 6' (1.829 m)    GENERAL: The patient is a well-nourished male, in no acute distress. The vital signs are documented above. CARDIAC: There is a regular rate and rhythm.  PULMONARY: Non-labored respirations ABDOMEN: Soft and non-tender  MUSCULOSKELETAL: There are no  major deformities or cyanosis. NEUROLOGIC: No focal weakness or paresthesias are detected. SKIN: There are no ulcers or rashes noted. PSYCHIATRIC: The patient has a normal affect.  STUDIES:   UA negative for blood  MEDICAL ISSUES:   Nutcracker syndrome: I think the next step is to get pressure measurements across the renal vein to confirm that there is an actual hemodynamically significant problem and also to look for reflux into the gonadal venous system.  Based on these results we will consider whether or not surgery to correct the issue is necessary.    Malvina Serene CLORE, MD, FACS [facility] [phone] Pager [phone]

## 2024-01-31 NOTE — Progress Notes (Signed)
 [facility] [place]  Patient name: Russell Walker MRN: [id number] DOB: 04/29/2000 Sex: male   REASON FOR VISIT:    Follow-up  HISOTRY OF PRESENT ILLNESS:   Russell Walker is a 23 y.o. 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  The patient states that he has had abdominal pain in his left flank radiating around to his left back since middle school.  He describes this as a stabbing pain and continuous.  He also notes that he has had prior urinalysis that have been positive for blood.  He has not had any prior abdominal surgery.  He does note that there is a family history of pancreatic and renal cancer.  He is a non-smoker.  I sent him for a urinalysis to look for blood.   PAST MEDICAL HISTORY:   Past Medical History:  Diagnosis Date   ADHD      FAMILY HISTORY:   History reviewed. No pertinent family history.  SOCIAL HISTORY:   Social History   Tobacco Use   Smoking status: Former   Smokeless tobacco: Never  Substance Use Topics   Alcohol use: Not Currently     ALLERGIES:   No Known Allergies   CURRENT MEDICATIONS:   No current outpatient medications on file.   No current facility-administered medications for this visit.    REVIEW OF SYSTEMS:   [X]  denotes positive finding, [ ]  denotes negative finding Cardiac  Comments:  Chest pain or chest pressure:    Shortness of breath upon exertion:    Short of breath when lying flat:    Irregular heart rhythm:        Vascular    Pain in calf, thigh, or hip brought on by ambulation:    Pain in feet at night that wakes you up from your sleep:     Blood clot in your veins:    Leg swelling:         Pulmonary    Oxygen at home:    Productive cough:     Wheezing:         Neurologic    Sudden weakness in arms or legs:     Sudden numbness in arms or legs:     Sudden onset of difficulty speaking or slurred speech:     Temporary loss of vision in one eye:     Problems with dizziness:         Gastrointestinal    Blood in stool:     Vomited blood:         Genitourinary    Burning when urinating:     Blood in urine:        Psychiatric    Major depression:         Hematologic    Bleeding problems:    Problems with blood clotting too easily:        Skin    Rashes or ulcers:        Constitutional    Fever or chills:      PHYSICAL EXAM:   Vitals:   01/31/24 1054  BP: 116/65  Pulse: 71  SpO2: 96%  Weight: 208 lb (94.3 kg)  Height: 6' (1.829 m)    GENERAL: The patient is a well-nourished male, in no acute distress. The vital signs are documented above. CARDIAC: There is a regular rate and rhythm.  PULMONARY: Non-labored respirations ABDOMEN: Soft and non-tender  MUSCULOSKELETAL: There are no  major deformities or cyanosis. NEUROLOGIC: No focal weakness or paresthesias are detected. SKIN: There are no ulcers or rashes noted. PSYCHIATRIC: The patient has a normal affect.  STUDIES:   UA negative for blood  MEDICAL ISSUES:   Nutcracker syndrome: I think the next step is to get pressure measurements across the renal vein to confirm that there is an actual hemodynamically significant problem and also to look for reflux into the gonadal venous system.  Based on these results we will consider whether or not surgery to correct the issue is necessary.    Malvina Serene CLORE, MD, FACS [facility] [phone] Pager [phone]

## 2024-02-01 ENCOUNTER — Other Ambulatory Visit: Payer: Self-pay

## 2024-02-01 DIAGNOSIS — I871 Compression of vein: Secondary | ICD-10-CM

## 2024-02-15 ENCOUNTER — Encounter (HOSPITAL_COMMUNITY)
Admission: RE | Disposition: A | Discharge status: Home / Self Care | Payer: Self-pay | Source: Home / Self Care | Attending: Surgery

## 2024-02-15 ENCOUNTER — Ambulatory Visit (HOSPITAL_COMMUNITY): Admission: RE | Admit: 2024-02-15 | Discharge: 2024-02-15 | Disposition: A | Attending: Surgery | Admitting: Surgery

## 2024-02-15 ENCOUNTER — Other Ambulatory Visit: Payer: Self-pay

## 2024-02-15 DIAGNOSIS — G8929 Other chronic pain: Secondary | ICD-10-CM | POA: Diagnosis not present

## 2024-02-15 DIAGNOSIS — R319 Hematuria, unspecified: Secondary | ICD-10-CM | POA: Diagnosis not present

## 2024-02-15 DIAGNOSIS — R1032 Left lower quadrant pain: Secondary | ICD-10-CM | POA: Diagnosis not present

## 2024-02-15 DIAGNOSIS — I871 Compression of vein: Secondary | ICD-10-CM

## 2024-02-15 HISTORY — PX: RENAL VENOGRAPHY: CATH118284

## 2024-02-15 HISTORY — PX: IVC VENOGRAPHY: CATH118301

## 2024-02-15 LAB — POCT I-STAT, CHEM 8
BUN: 8 mg/dL (ref 6–20)
Calcium, Ion: 1.19 mmol/L (ref 1.15–1.40)
Chloride: 103 mmol/L (ref 98–111)
Creatinine, Ser: 1.1 mg/dL (ref 0.61–1.24)
Glucose, Bld: 87 mg/dL (ref 70–99)
HCT: 44 % (ref 39.0–52.0)
Hemoglobin: 15 g/dL (ref 13.0–17.0)
Potassium: 4 mmol/L (ref 3.5–5.1)
Sodium: 142 mmol/L (ref 135–145)
TCO2: 28 mmol/L (ref 22–32)

## 2024-02-15 SURGERY — IVC VENOGRAPHY
Anesthesia: LOCAL

## 2024-02-15 MED ORDER — MIDAZOLAM HCL 2 MG/2ML IJ SOLN
INTRAMUSCULAR | Status: AC
  Filled 2024-02-15: qty 2

## 2024-02-15 MED ORDER — IODIXANOL 320 MG/ML IV SOLN
INTRAVENOUS | Status: DC | PRN
  Administered 2024-02-15: 55 mL

## 2024-02-15 MED ORDER — LIDOCAINE HCL (PF) 1 % IJ SOLN
INTRAMUSCULAR | Status: AC
  Filled 2024-02-15: qty 30

## 2024-02-15 MED ORDER — LIDOCAINE HCL (PF) 1 % IJ SOLN
INTRAMUSCULAR | Status: DC | PRN
  Administered 2024-02-15: 15 mL

## 2024-02-15 MED ORDER — MIDAZOLAM HCL (PF) 2 MG/2ML IJ SOLN
INTRAMUSCULAR | Status: DC | PRN
  Administered 2024-02-15: 1 mg via INTRAVENOUS

## 2024-02-15 MED ORDER — HEPARIN (PORCINE) IN NACL 2000-0.9 UNIT/L-% IV SOLN
INTRAVENOUS | Status: DC | PRN
Start: 2024-02-15 — End: 2024-02-15
  Administered 2024-02-15: 1000 mL

## 2024-02-15 MED ORDER — FENTANYL CITRATE (PF) 100 MCG/2ML IJ SOLN
INTRAMUSCULAR | Status: AC
  Filled 2024-02-15: qty 2

## 2024-02-15 MED ORDER — SODIUM CHLORIDE 0.9 % IV SOLN: INTRAVENOUS | Status: DC

## 2024-02-15 MED ORDER — FENTANYL CITRATE (PF) 100 MCG/2ML IJ SOLN
INTRAMUSCULAR | Status: DC | PRN
  Administered 2024-02-15: 25 ug via INTRAVENOUS

## 2024-02-15 SURGICAL SUPPLY — 10 items
CATH ANGIO 5F BER2 65CM (CATHETERS) IMPLANT
CATH OMNI FLUSH 5F 65CM (CATHETERS) IMPLANT
CATH TEMPO 5F STRAIGHT 65 (CATHETERS) IMPLANT
DEVICE TORQUE SEADRAGON GRN (MISCELLANEOUS) IMPLANT
GUIDE CATH VISTA RDC 6F (CATHETERS) IMPLANT
GUIDEWIRE ANGLED .035 180CM (WIRE) IMPLANT
KIT MICROPUNCTURE NIT STIFF (SHEATH) IMPLANT
SET ATX-X65L (MISCELLANEOUS) IMPLANT
SHEATH PINNACLE 6F 10CM (SHEATH) IMPLANT
WIRE BENTSON .035X145CM (WIRE) IMPLANT

## 2024-02-15 NOTE — Op Note (Signed)
    Patient name: Russell Walker MRN: [id number] DOB: 2000/08/10 Sex: male  02/15/2024 Pre-operative Diagnosis: Nutcracker syndrome Post-operative diagnosis:  Same Surgeon:  Malvina New Procedure Performed:  1.  Ultrasound-guided access, right femoral vein  2.  IVC venogram  3.  Selective injections with catheter in the left renal vein, left adrenal vein, and left gonadal vein  4.  Conscious sedation, 30 minutes  5.  Pullback pressures across the renal vein and IVC   Indications: This is a 23 year old gentleman with chronic abdominal pain and hematuria who had a CT scan that showed nutcracker phenomenon.  He comes in today for further evaluation  Procedure:  The patient was identified in the holding area and taken to room 8.  The patient was then placed supine on the table and prepped and draped in the usual sterile fashion.  A time out was called.  Conscious sedation was administered with the use of IV fentanyl and Versed under continuous physician and nurse monitoring.  Heart rate, blood pressure, and oxygen saturation were continuously monitored.  Total sedation time was 30 minutes.  Ultrasound was used to evaluate right femoral vein which was widely patent and easily compressible.  It was cannulated under ultrasound guidance with a micropuncture needle.  A 018 wire was inserted followed by placement of a micropuncture sheath.  I then upsized to a 6 [place] sheath over a Bentson wire.  Catheter was placed into the right iliac vein and a IVC venogram was performed.  Next I used a renal double curve catheter to select the left renal vein and perform selective injections with the catheter at this level.  Pullback pressures across the renal vein and IVC were obtained.  I also used a Berenstein 2 catheter to select the left gonadal vein and perform contrast injections as well as the left adrenal vein with subsequent injections.  Manual pressure was used for hemostasis  Findings:   Inferior vena  cava: Normal appearance of the IVC  Left renal vein and branches: Stenosis is identified in the left renal vein.  This could not be quantified.  I did check pullback pressures across the area of concern in the IVC and there was no significant pressure gradient.  Selective injections with the catheter in the gonadal vein showed significant collateral network and varicosities suggesting a significant lesion in the renal vein.  No significant abnormalities were identified in the adrenal vein.    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  No significant pressure gradient was identified across the lesion however numerous collaterals and varicosities were identified with selective injections in the gonadal vein suggesting the lesion is hemodynamically significant  #2  Patient will be scheduled for left renal vein transplantation   V. Malvina New, M.D., [facility] [place] Office: [phone] Pager:  [phone]

## 2024-02-15 NOTE — Progress Notes (Signed)
 Discharge instruction reviewed with patient and Father Dasie at the bedside. Denies questions or concerns. PT ambulated int he hallway. No s/s of complications at the incision site. PT was able to tolerate PO intake. Void in the bathroom. PT was escorted from the unit via wheelchair to personal vehicle.

## 2024-02-15 NOTE — Interval H&P Note (Signed)
 History and Physical Interval Note:  02/15/2024 10:22 AM  Russell Walker  has presented today for surgery, with the diagnosis of intrapement syndrome left renal vein.  The various methods of treatment have been discussed with the patient and family. After consideration of risks, benefits and other options for treatment, the patient has consented to  Procedure(s): IVC Venography (N/A) RENAL VENOGRAPHY (N/A) as a surgical intervention.  The patient's history has been reviewed, patient examined, no change in status, stable for surgery.  I have reviewed the patient's chart and labs.  Questions were answered to the patient's satisfaction.     Malvina New

## 2024-02-15 NOTE — Discharge Instructions (Signed)
 Femoral Site Care This sheet gives you information about how to care for yourself after your procedure. Your health care provider may also give you more specific instructions. If you have problems or questions, contact your health care provider. What can I expect after the procedure?  After the procedure, it is common to have: Bruising that usually fades within 1-2 weeks. Tenderness at the site. Follow these instructions at home: Wound care Follow instructions from your health care provider about how to take care of your insertion site. Make sure you: Wash your hands with soap and water before you change your bandage (dressing). If soap and water are not available, use hand sanitizer. Remove your dressing as told by your health care provider. In 24 hours Do not take baths, swim, or use a hot tub until your health care provider approves. You may shower 24-48 hours after the procedure or as told by your health care provider. Gently wash the site with plain soap and water. Pat the area dry with a clean towel. Do not rub the site. This may cause bleeding. Do not apply powder or lotion to the site. Keep the site clean and dry. Check your femoral site every day for signs of infection. Check for: Redness, swelling, or pain. Fluid or blood. Warmth. Pus or a bad smell. Activity For the first 2-3 days after your procedure, or as long as directed: Avoid climbing stairs as much as possible. Do not squat. Do not lift anything that is heavier than 10 lb (4.5 kg), or the limit that you are told, until your health care provider says that it is safe. For 5 days Rest as directed. Avoid sitting for a long time without moving. Get up to take short walks every 1-2 hours. Do not drive for 24 hours if you were given a medicine to help you relax (sedative). General instructions Take over-the-counter and prescription medicines only as told by your health care provider. Keep all follow-up visits as told by  your health care provider. This is important. Contact a health care provider if you have: A fever or chills. You have redness, swelling, or pain around your insertion site. Get help right away if: The catheter insertion area swells very fast. You pass out. You suddenly start to sweat or your skin gets clammy. The catheter insertion area is bleeding, and the bleeding does not stop when you hold steady pressure on the area. The area near or just beyond the catheter insertion site becomes pale, cool, tingly, or numb. These symptoms may represent a serious problem that is an emergency. Do not wait to see if the symptoms will go away. Get medical help right away. Call your local emergency services (911 in the [place] Do not drive yourself to the hospital. Summary After the procedure, it is common to have bruising that usually fades within 1-2 weeks. Check your femoral site every day for signs of infection. Do not lift anything that is heavier than 10 lb (4.5 kg), or the limit that you are told, until your health care provider says that it is safe. This information is not intended to replace advice given to you by your health care provider. Make sure you discuss any questions you have with your health care provider. Document Revised: 04/26/2017 Document Reviewed: 04/26/2017 Elsevier Patient Education  2020 [employer].

## 2024-02-16 ENCOUNTER — Encounter: Payer: Self-pay | Admitting: Surgery

## 2024-02-16 ENCOUNTER — Encounter (HOSPITAL_COMMUNITY): Payer: Self-pay | Admitting: Surgery

## 2024-02-16 ENCOUNTER — Other Ambulatory Visit: Payer: Self-pay

## 2024-02-16 DIAGNOSIS — I871 Compression of vein: Secondary | ICD-10-CM

## 2024-03-03 NOTE — Progress Notes (Signed)
 Surgical Instructions   Your procedure is scheduled on Friday March 10, 2024. Report to [facility] Main Entrance A at 7:50 A.M., then check in with the Admitting office. Any questions or running late day of surgery: call [phone]  Questions prior to your surgery date: call [phone], Monday-Friday, 8am-4pm. If you experience any cold or flu symptoms such as cough, fever, chills, shortness of breath, etc. between now and your scheduled surgery, please notify us  at the above number.     Remember:  Do not eat or drink after midnight the night before your surgery  Take these medicines the morning of surgery with A SIP OF WATER: None   May take these medicines IF NEEDED: None    One week prior to surgery, STOP taking any Aspirin (unless otherwise instructed by your surgeon) Aleve, Naproxen, Ibuprofen, Motrin, Advil, Goody's, BC's, all herbal medications, fish oil, and non-prescription vitamins.                     Do NOT Smoke (Tobacco/Vaping) for 24 hours prior to your procedure.  If you use a CPAP at night, you may bring your mask/headgear for your overnight stay.   You will be asked to remove any contacts, glasses, piercing's, hearing aid's, dentures/partials prior to surgery. Please bring cases for these items if needed.    Patients discharged the day of surgery will not be allowed to drive home, and someone needs to stay with them for 24 hours.  SURGICAL WAITING ROOM VISITATION Patients may have no more than 2 support people in the waiting area - these visitors may rotate.   Pre-op nurse will coordinate an appropriate time for 1 ADULT support person, who may not rotate, to accompany patient in pre-op.  Children under the age of 17 must have an adult with them who is not the patient and must remain in the main waiting area with an adult.  If the patient needs to stay at the hospital during part of their recovery, the visitor guidelines for inpatient rooms  apply.  Please refer to the [facility] website for the visitor guidelines for any additional information.   If you received a COVID test during your pre-op visit  it is requested that you wear a mask when out in public, stay away from anyone that may not be feeling well and notify your surgeon if you develop symptoms. If you have been in contact with anyone that has tested positive in the last 10 days please notify you surgeon.      Pre-operative CHG Bathing Instructions   You can play a key role in reducing the risk of infection after surgery. Your skin needs to be as free of germs as possible. You can reduce the number of germs on your skin by washing with CHG (chlorhexidine gluconate) soap before surgery. CHG is an antiseptic soap that kills germs and continues to kill germs even after washing.   DO NOT use if you have an allergy to chlorhexidine/CHG or antibacterial soaps. If your skin becomes reddened or irritated, stop using the CHG and notify one of our RNs at [phone].              TAKE A SHOWER THE NIGHT BEFORE SURGERY   Please keep in mind the following:  You may shave your face before/day of surgery.  Place clean sheets on your bed the night before surgery Use a clean washcloth (not used since being washed) for shower. DO NOT sleep  with pet's night before surgery.  CHG Shower Instructions:  Wash your face and private area with normal soap. If you choose to wash your hair, wash first with your normal shampoo.  After you use shampoo/soap, rinse your hair and body thoroughly to remove shampoo/soap residue.  Turn the water OFF and apply half the bottle of CHG soap to a CLEAN washcloth.  Apply CHG soap ONLY FROM YOUR NECK DOWN TO YOUR TOES (washing for 3-5 minutes)  DO NOT use CHG soap on face, private areas, open wounds, or sores.  Pay special attention to the area where your surgery is being performed.  If you are having back surgery, having someone wash your back for you  may be helpful. Wait 2 minutes after CHG soap is applied, then you may rinse off the CHG soap.  Pat dry with a clean towel  Put on clean pajamas    Additional instructions for the day of surgery: If you choose, you may shower the morning of surgery with an antibacterial soap.  DO NOT APPLY any lotions, deodorants or cologne Do not wear jewelry Do not bring valuables to the hospital. [facility] is not responsible for valuables/personal belongings. Put on clean/comfortable clothes.  Please brush your teeth.  Ask your nurse before applying any prescription medications to the skin.

## 2024-03-06 ENCOUNTER — Encounter (HOSPITAL_COMMUNITY): Payer: Self-pay

## 2024-03-06 ENCOUNTER — Other Ambulatory Visit: Payer: Self-pay

## 2024-03-06 ENCOUNTER — Encounter (HOSPITAL_COMMUNITY)
Admission: RE | Admit: 2024-03-06 | Discharge: 2024-03-06 | Disposition: A | Discharge status: Home / Self Care | Source: Ambulatory Visit | Attending: Surgery | Admitting: Surgery

## 2024-03-06 VITALS — BP 108/84 | HR 71 | Temp 98.3°F | Resp 16 | Ht 71.0 in | Wt 202.3 lb

## 2024-03-06 DIAGNOSIS — I871 Compression of vein: Secondary | ICD-10-CM | POA: Diagnosis not present

## 2024-03-06 DIAGNOSIS — Z01818 Encounter for other preprocedural examination: Secondary | ICD-10-CM

## 2024-03-06 DIAGNOSIS — Z01812 Encounter for preprocedural laboratory examination: Secondary | ICD-10-CM | POA: Diagnosis not present

## 2024-03-06 LAB — SURGICAL PCR SCREEN
MRSA, PCR: NEGATIVE
Staphylococcus aureus: NEGATIVE

## 2024-03-06 LAB — URINALYSIS, ROUTINE W REFLEX MICROSCOPIC
Bacteria, UA: NONE SEEN
Bilirubin Urine: NEGATIVE
Glucose, UA: NEGATIVE mg/dL
Ketones, ur: NEGATIVE mg/dL
Leukocytes,Ua: NEGATIVE
Nitrite: NEGATIVE
Protein, ur: NEGATIVE mg/dL
Specific Gravity, Urine: 1.025 (ref 1.005–1.030)
pH: 5 (ref 5.0–8.0)

## 2024-03-06 LAB — COMPREHENSIVE METABOLIC PANEL WITH GFR
ALT: 13 U/L (ref 0–44)
AST: 14 U/L — ABNORMAL LOW (ref 15–41)
Albumin: 4.3 g/dL (ref 3.5–5.0)
Alkaline Phosphatase: 65 U/L (ref 38–126)
Anion gap: 10 (ref 5–15)
BUN: 16 mg/dL (ref 6–20)
CO2: 27 mmol/L (ref 22–32)
Calcium: 9.2 mg/dL (ref 8.9–10.3)
Chloride: 104 mmol/L (ref 98–111)
Creatinine, Ser: 1.11 mg/dL (ref 0.61–1.24)
GFR, Estimated: >60 mL/min (ref >60)
Glucose, Bld: 86 mg/dL (ref 70–99)
Potassium: 4.2 mmol/L (ref 3.5–5.1)
Sodium: 141 mmol/L (ref 135–145)
Total Bilirubin: 1 mg/dL (ref 0.0–1.2)
Total Protein: 7.2 g/dL (ref 6.5–8.1)

## 2024-03-06 LAB — CBC
HCT: 44.3 % (ref 39.0–52.0)
Hemoglobin: 14.8 g/dL (ref 13.0–17.0)
MCH: 30 pg (ref 26.0–34.0)
MCHC: 33.4 g/dL (ref 30.0–36.0)
MCV: 89.9 fL (ref 80.0–100.0)
Platelets: 327 K/uL (ref 150–400)
RBC: 4.93 MIL/uL (ref 4.22–5.81)
RDW: 12.5 % (ref 11.5–15.5)
WBC: 5.2 K/uL (ref 4.0–10.5)
nRBC: 0 % (ref 0.0–0.2)

## 2024-03-06 LAB — APTT: aPTT: 37 s — ABNORMAL HIGH (ref 24–36)

## 2024-03-06 LAB — PROTIME-INR
INR: 1.2 (ref 0.8–1.2)
Prothrombin Time: 16.1 s — ABNORMAL HIGH (ref 11.4–15.2)

## 2024-03-06 NOTE — Progress Notes (Signed)
 PCP - Mliss Chill, PA Cardiologist -   PPM/ICD - denies Device Orders - na Rep Notified - na  Chest x-ray - na EKG - 02/15/2024 Stress Test -  ECHO -  Cardiac Cath -   Sleep Study - denies CPAP - na  Non-diabetic  Blood Thinner Instructions:denies Aspirin Instructions:denies  ERAS Protcol - NPO  Anesthesia review: Yes  Patient denies shortness of breath, fever, cough and chest pain at PAT appointment   All instructions explained to the patient, with a verbal understanding of the material. Patient agrees to go over the instructions while at home for a better understanding. Patient also instructed to self quarantine after being tested for COVID-19. The opportunity to ask questions was provided.

## 2024-03-09 ENCOUNTER — Encounter (HOSPITAL_COMMUNITY): Payer: Self-pay | Admitting: Surgery

## 2024-03-10 ENCOUNTER — Inpatient Hospital Stay (HOSPITAL_COMMUNITY)

## 2024-03-10 ENCOUNTER — Inpatient Hospital Stay (HOSPITAL_COMMUNITY)
Admission: RE | Admit: 2024-03-10 | Discharge: 2024-03-14 | DRG: 254 | Disposition: A | Discharge status: Home / Self Care | Attending: Surgery | Admitting: Surgery

## 2024-03-10 ENCOUNTER — Inpatient Hospital Stay (HOSPITAL_COMMUNITY): Admitting: Certified Registered Nurse Anesthetist

## 2024-03-10 ENCOUNTER — Other Ambulatory Visit: Payer: Self-pay

## 2024-03-10 ENCOUNTER — Encounter (HOSPITAL_COMMUNITY)
Admission: RE | Disposition: A | Discharge status: Home / Self Care | Payer: Self-pay | Source: Home / Self Care | Attending: Surgery

## 2024-03-10 ENCOUNTER — Encounter (HOSPITAL_COMMUNITY): Payer: Self-pay | Admitting: Surgery

## 2024-03-10 DIAGNOSIS — Z8249 Family history of ischemic heart disease and other diseases of the circulatory system: Secondary | ICD-10-CM | POA: Diagnosis not present

## 2024-03-10 DIAGNOSIS — F909 Attention-deficit hyperactivity disorder, unspecified type: Secondary | ICD-10-CM | POA: Diagnosis present

## 2024-03-10 DIAGNOSIS — Z800 Family history of malignant neoplasm of digestive organs: Secondary | ICD-10-CM

## 2024-03-10 DIAGNOSIS — Z8051 Family history of malignant neoplasm of kidney: Secondary | ICD-10-CM

## 2024-03-10 DIAGNOSIS — I871 Compression of vein: Principal | ICD-10-CM | POA: Diagnosis present

## 2024-03-10 DIAGNOSIS — I714 Abdominal aortic aneurysm, without rupture, unspecified: Secondary | ICD-10-CM

## 2024-03-10 DIAGNOSIS — Z87891 Personal history of nicotine dependence: Secondary | ICD-10-CM

## 2024-03-10 HISTORY — PX: ABDOMINAL AORTIC ANEURYSM REPAIR: SHX42

## 2024-03-10 LAB — CBC
HCT: 40 % (ref 39.0–52.0)
Hemoglobin: 13.8 g/dL (ref 13.0–17.0)
MCH: 30.5 pg (ref 26.0–34.0)
MCHC: 34.5 g/dL (ref 30.0–36.0)
MCV: 88.5 fL (ref 80.0–100.0)
Platelets: 296 K/uL (ref 150–400)
RBC: 4.52 MIL/uL (ref 4.22–5.81)
RDW: 12.7 % (ref 11.5–15.5)
WBC: 18.1 K/uL — ABNORMAL HIGH (ref 4.0–10.5)
nRBC: 0 % (ref 0.0–0.2)

## 2024-03-10 LAB — COMPREHENSIVE METABOLIC PANEL WITH GFR
ALT: 13 U/L (ref 0–44)
AST: 14 U/L — ABNORMAL LOW (ref 15–41)
Albumin: 4.4 g/dL (ref 3.5–5.0)
Alkaline Phosphatase: 54 U/L (ref 38–126)
Anion gap: 11 (ref 5–15)
BUN: 15 mg/dL (ref 6–20)
CO2: 24 mmol/L (ref 22–32)
Calcium: 8.7 mg/dL — ABNORMAL LOW (ref 8.9–10.3)
Chloride: 104 mmol/L (ref 98–111)
Creatinine, Ser: 1.1 mg/dL (ref 0.61–1.24)
GFR, Estimated: >60 mL/min (ref >60)
Glucose, Bld: 138 mg/dL — ABNORMAL HIGH (ref 70–99)
Potassium: 3.6 mmol/L (ref 3.5–5.1)
Sodium: 139 mmol/L (ref 135–145)
Total Bilirubin: 0.8 mg/dL (ref 0.0–1.2)
Total Protein: 6.7 g/dL (ref 6.5–8.1)

## 2024-03-10 LAB — POCT I-STAT 7, (LYTES, BLD GAS, ICA,H+H)
Acid-base deficit: 2 mmol/L (ref 0.0–2.0)
Acid-base deficit: 3 mmol/L — ABNORMAL HIGH (ref 0.0–2.0)
Acid-base deficit: 3 mmol/L — ABNORMAL HIGH (ref 0.0–2.0)
Acid-base deficit: 4 mmol/L — ABNORMAL HIGH (ref 0.0–2.0)
Bicarbonate: 23.7 mmol/L (ref 20.0–28.0)
Bicarbonate: 22.4 mmol/L (ref 20.0–28.0)
Bicarbonate: 22.6 mmol/L (ref 20.0–28.0)
Bicarbonate: 21.8 mmol/L (ref 20.0–28.0)
Calcium, Ion: 1.24 mmol/L (ref 1.15–1.40)
Calcium, Ion: 1.19 mmol/L (ref 1.15–1.40)
Calcium, Ion: 1.17 mmol/L (ref 1.15–1.40)
Calcium, Ion: 1.19 mmol/L (ref 1.15–1.40)
HCT: 40 % (ref 39.0–52.0)
HCT: 38 % — ABNORMAL LOW (ref 39.0–52.0)
HCT: 36 % — ABNORMAL LOW (ref 39.0–52.0)
HCT: 40 % (ref 39.0–52.0)
Hemoglobin: 13.6 g/dL (ref 13.0–17.0)
Hemoglobin: 12.9 g/dL — ABNORMAL LOW (ref 13.0–17.0)
Hemoglobin: 12.2 g/dL — ABNORMAL LOW (ref 13.0–17.0)
Hemoglobin: 13.6 g/dL (ref 13.0–17.0)
O2 Saturation: 100 %
O2 Saturation: 100 %
O2 Saturation: 100 %
O2 Saturation: 95 %
Patient temperature: 36.1
Patient temperature: 36
Patient temperature: 36.3
Patient temperature: 99.1
Potassium: 4.5 mmol/L (ref 3.5–5.1)
Potassium: 4.6 mmol/L (ref 3.5–5.1)
Potassium: 4.6 mmol/L (ref 3.5–5.1)
Potassium: 3.8 mmol/L (ref 3.5–5.1)
Sodium: 139 mmol/L (ref 135–145)
Sodium: 138 mmol/L (ref 135–145)
Sodium: 139 mmol/L (ref 135–145)
Sodium: 141 mmol/L (ref 135–145)
TCO2: 25 mmol/L (ref 22–32)
TCO2: 24 mmol/L (ref 22–32)
TCO2: 24 mmol/L (ref 22–32)
TCO2: 23 mmol/L (ref 22–32)
pCO2 arterial: 40.9 mmHg (ref 32–48)
pCO2 arterial: 37.4 mmHg (ref 32–48)
pCO2 arterial: 39.3 mmHg (ref 32–48)
pCO2 arterial: 42.6 mmHg (ref 32–48)
pH, Arterial: 7.368 (ref 7.35–7.45)
pH, Arterial: 7.381 (ref 7.35–7.45)
pH, Arterial: 7.365 (ref 7.35–7.45)
pH, Arterial: 7.318 — ABNORMAL LOW (ref 7.35–7.45)
pO2, Arterial: 306 mmHg — ABNORMAL HIGH (ref 83–108)
pO2, Arterial: 270 mmHg — ABNORMAL HIGH (ref 83–108)
pO2, Arterial: 275 mmHg — ABNORMAL HIGH (ref 83–108)
pO2, Arterial: 82 mmHg — ABNORMAL LOW (ref 83–108)

## 2024-03-10 LAB — POCT ACTIVATED CLOTTING TIME
Activated Clotting Time: 181 s
Activated Clotting Time: 245 s

## 2024-03-10 LAB — MAGNESIUM: Magnesium: 1.6 mg/dL — ABNORMAL LOW (ref 1.7–2.4)

## 2024-03-10 LAB — PROTIME-INR
INR: 1.3 — ABNORMAL HIGH (ref 0.8–1.2)
Prothrombin Time: 17.3 s — ABNORMAL HIGH (ref 11.4–15.2)

## 2024-03-10 LAB — PREPARE RBC (CROSSMATCH)

## 2024-03-10 LAB — APTT: aPTT: 35 s (ref 24–36)

## 2024-03-10 SURGERY — ANEURYSM ABDOMINAL AORTIC REPAIR
Anesthesia: Regional

## 2024-03-10 MED ORDER — ACETAMINOPHEN 325 MG RE SUPP: 325.0000 mg | RECTAL | Status: DC | PRN

## 2024-03-10 MED ORDER — ROCURONIUM BROMIDE 10 MG/ML (PF) SYRINGE
PREFILLED_SYRINGE | INTRAVENOUS | Status: AC
  Filled 2024-03-10: qty 10

## 2024-03-10 MED ORDER — DEXMEDETOMIDINE HCL IN NACL 80 MCG/20ML IV SOLN
INTRAVENOUS | Status: AC
Start: 2024-03-10 — End: 2024-03-10
  Filled 2024-03-10: qty 20

## 2024-03-10 MED ORDER — NALOXONE HCL 0.4 MG/ML IJ SOLN: 0.4000 mg | INTRAMUSCULAR | Status: DC | PRN

## 2024-03-10 MED ORDER — FAMOTIDINE IN NACL 20-0.9 MG/50ML-% IV SOLN
20.0000 mg | Freq: Two times a day (BID) | INTRAVENOUS | Status: DC
  Administered 2024-03-10 – 2024-03-14 (×9): 20 mg via INTRAVENOUS
  Filled 2024-03-10 (×10): qty 50

## 2024-03-10 MED ORDER — OXYCODONE HCL 5 MG/5ML PO SOLN: 5.0000 mg | Freq: Once | ORAL | Status: DC | PRN

## 2024-03-10 MED ORDER — BISACODYL 10 MG RE SUPP: 10.0000 mg | Freq: Every day | RECTAL | Status: DC | PRN

## 2024-03-10 MED ORDER — MIDAZOLAM HCL (PF) 2 MG/2ML IJ SOLN
INTRAMUSCULAR | Status: DC | PRN
  Administered 2024-03-10: 2 mg via INTRAVENOUS

## 2024-03-10 MED ORDER — PHENYLEPHRINE 80 MCG/ML (10ML) SYRINGE FOR IV PUSH (FOR BLOOD PRESSURE SUPPORT)
PREFILLED_SYRINGE | INTRAVENOUS | Status: DC | PRN
  Administered 2024-03-10: 80 ug via INTRAVENOUS

## 2024-03-10 MED ORDER — ROCURONIUM BROMIDE 10 MG/ML (PF) SYRINGE
PREFILLED_SYRINGE | INTRAVENOUS | Status: DC | PRN
  Administered 2024-03-10: 80 mg via INTRAVENOUS
  Administered 2024-03-10: 30 mg via INTRAVENOUS
  Administered 2024-03-10: 20 mg via INTRAVENOUS

## 2024-03-10 MED ORDER — ONDANSETRON HCL 4 MG/2ML IJ SOLN
4.0000 mg | Freq: Four times a day (QID) | INTRAMUSCULAR | Status: DC | PRN
  Administered 2024-03-10 – 2024-03-11 (×3): 4 mg via INTRAVENOUS
  Filled 2024-03-10 (×3): qty 2

## 2024-03-10 MED ORDER — HYDROMORPHONE 1 MG/ML IV SOLN
INTRAVENOUS | Status: DC
  Administered 2024-03-10: 1.9 mg via INTRAVENOUS
  Administered 2024-03-10: 30 mg via INTRAVENOUS
  Administered 2024-03-11: 0.4 mg via INTRAVENOUS
  Administered 2024-03-11: 0.2 mg via INTRAVENOUS
  Administered 2024-03-11: 1 mL via INTRAVENOUS
  Filled 2024-03-10 (×3): qty 30

## 2024-03-10 MED ORDER — LIDOCAINE 2% (20 MG/ML) 5 ML SYRINGE
INTRAMUSCULAR | Status: AC
  Filled 2024-03-10: qty 5

## 2024-03-10 MED ORDER — CLEVIDIPINE BUTYRATE 0.5 MG/ML IV EMUL
INTRAVENOUS | Status: DC | PRN
  Administered 2024-03-10: 2 mg/h via INTRAVENOUS

## 2024-03-10 MED ORDER — DIPHENHYDRAMINE HCL 12.5 MG/5ML PO ELIX: 12.5000 mg | ORAL_SOLUTION | Freq: Four times a day (QID) | ORAL | Status: DC | PRN

## 2024-03-10 MED ORDER — HEPARIN SODIUM (PORCINE) 5000 UNIT/ML IJ SOLN
5000.0000 [IU] | Freq: Three times a day (TID) | INTRAMUSCULAR | Status: DC
  Administered 2024-03-11 – 2024-03-14 (×10): 5000 [IU] via SUBCUTANEOUS
  Filled 2024-03-10 (×10): qty 1

## 2024-03-10 MED ORDER — FENTANYL CITRATE (PF) 250 MCG/5ML IJ SOLN
INTRAMUSCULAR | Status: AC
  Filled 2024-03-10: qty 5

## 2024-03-10 MED ORDER — ONDANSETRON HCL 4 MG/2ML IJ SOLN
INTRAMUSCULAR | Status: DC | PRN
  Administered 2024-03-10: 4 mg via INTRAVENOUS

## 2024-03-10 MED ORDER — OXYMETAZOLINE HCL 0.05 % NA SOLN
NASAL | Status: DC | PRN
  Administered 2024-03-10: 3 via NASAL

## 2024-03-10 MED ORDER — SODIUM CHLORIDE 0.9 % IV SOLN: INTRAVENOUS | Status: DC

## 2024-03-10 MED ORDER — PHENYLEPHRINE HCL-NACL 20-0.9 MG/250ML-% IV SOLN
INTRAVENOUS | Status: DC | PRN
  Administered 2024-03-10: 20 ug/min via INTRAVENOUS

## 2024-03-10 MED ORDER — LACTATED RINGERS IV SOLN: INTRAVENOUS | Status: DC

## 2024-03-10 MED ORDER — SODIUM CHLORIDE 0.9% IV SOLUTION: Freq: Once | INTRAVENOUS | Status: DC

## 2024-03-10 MED ORDER — EPHEDRINE 5 MG/ML INJ
INTRAVENOUS | Status: AC
  Filled 2024-03-10: qty 5

## 2024-03-10 MED ORDER — HEPARIN SODIUM (PORCINE) 1000 UNIT/ML IJ SOLN
INTRAMUSCULAR | Status: DC | PRN
  Administered 2024-03-10: 5000 [IU] via INTRAVENOUS
  Administered 2024-03-10: 10000 [IU] via INTRAVENOUS

## 2024-03-10 MED ORDER — PHENOL 1.4 % MT LIQD
1.0000 | OROMUCOSAL | Status: DC | PRN
  Filled 2024-03-10: qty 177

## 2024-03-10 MED ORDER — ACETAMINOPHEN 10 MG/ML IV SOLN: 1000.0000 mg | Freq: Once | INTRAVENOUS | Status: DC | PRN

## 2024-03-10 MED ORDER — CHLORHEXIDINE GLUCONATE 0.12 % MT SOLN
15.0000 mL | Freq: Once | OROMUCOSAL | Status: AC
  Administered 2024-03-10: 15 mL via OROMUCOSAL
  Filled 2024-03-10: qty 15

## 2024-03-10 MED ORDER — ONDANSETRON HCL 4 MG/2ML IJ SOLN: 4.0000 mg | Freq: Once | INTRAMUSCULAR | Status: DC | PRN

## 2024-03-10 MED ORDER — PROPOFOL 10 MG/ML IV BOLUS
INTRAVENOUS | Status: AC
Start: 2024-03-10 — End: 2024-03-10
  Filled 2024-03-10: qty 20

## 2024-03-10 MED ORDER — HEPARIN 6000 UNIT IRRIGATION SOLUTION
Status: DC | PRN
  Administered 2024-03-10: 1

## 2024-03-10 MED ORDER — POTASSIUM CHLORIDE 10 MEQ/100ML IV SOLN
10.0000 meq | Freq: Every day | INTRAVENOUS | Status: AC | PRN
  Administered 2024-03-10 (×3): 10 meq via INTRAVENOUS
  Filled 2024-03-10: qty 100

## 2024-03-10 MED ORDER — SUGAMMADEX SODIUM 200 MG/2ML IV SOLN
INTRAVENOUS | Status: DC | PRN
  Administered 2024-03-10: 200 mg via INTRAVENOUS

## 2024-03-10 MED ORDER — CEFAZOLIN SODIUM-DEXTROSE 2-4 GM/100ML-% IV SOLN
2.0000 g | INTRAVENOUS | Status: AC
  Administered 2024-03-10: 2 g via INTRAVENOUS
  Filled 2024-03-10: qty 100

## 2024-03-10 MED ORDER — LACTATED RINGERS IV SOLN: INTRAVENOUS | Status: AC

## 2024-03-10 MED ORDER — ACETAMINOPHEN 10 MG/ML IV SOLN
INTRAVENOUS | Status: DC | PRN
  Administered 2024-03-10: 1000 mg via INTRAVENOUS

## 2024-03-10 MED ORDER — ACETAMINOPHEN 10 MG/ML IV SOLN
1000.0000 mg | Freq: Four times a day (QID) | INTRAVENOUS | Status: AC
  Administered 2024-03-10 – 2024-03-11 (×4): 1000 mg via INTRAVENOUS
  Filled 2024-03-10 (×4): qty 100

## 2024-03-10 MED ORDER — DEXMEDETOMIDINE HCL IN NACL 80 MCG/20ML IV SOLN
INTRAVENOUS | Status: DC | PRN
  Administered 2024-03-10 (×2): 4 ug via INTRAVENOUS

## 2024-03-10 MED ORDER — CHLORHEXIDINE GLUCONATE CLOTH 2 % EX PADS: 6.0000 | MEDICATED_PAD | Freq: Once | CUTANEOUS | Status: DC

## 2024-03-10 MED ORDER — ACETAMINOPHEN 10 MG/ML IV SOLN
INTRAVENOUS | Status: AC
  Filled 2024-03-10: qty 100

## 2024-03-10 MED ORDER — HEPARIN 6000 UNIT IRRIGATION SOLUTION
Status: AC
Start: 2024-03-10 — End: 2024-03-10
  Filled 2024-03-10: qty 500

## 2024-03-10 MED ORDER — 0.9 % SODIUM CHLORIDE (POUR BTL) OPTIME
TOPICAL | Status: DC | PRN
  Administered 2024-03-10: 2000 mL

## 2024-03-10 MED ORDER — METHOCARBAMOL 1000 MG/10ML IJ SOLN
INTRAMUSCULAR | Status: AC
  Filled 2024-03-10: qty 10

## 2024-03-10 MED ORDER — PROTAMINE SULFATE 10 MG/ML IV SOLN
INTRAVENOUS | Status: AC
  Filled 2024-03-10: qty 5

## 2024-03-10 MED ORDER — FLEET ENEMA RE ENEM: 1.0000 | ENEMA | Freq: Once | RECTAL | Status: DC | PRN

## 2024-03-10 MED ORDER — LACTATED RINGERS IV SOLN
INTRAVENOUS | Status: DC | PRN
Start: 2024-03-10 — End: 2024-03-10

## 2024-03-10 MED ORDER — MIDAZOLAM HCL 2 MG/2ML IJ SOLN
INTRAMUSCULAR | Status: AC
  Filled 2024-03-10: qty 2

## 2024-03-10 MED ORDER — ALBUMIN HUMAN 5 % IV SOLN: INTRAVENOUS | Status: DC | PRN

## 2024-03-10 MED ORDER — METOPROLOL TARTRATE 5 MG/5ML IV SOLN: 2.5000 mg | INTRAVENOUS | Status: DC | PRN

## 2024-03-10 MED ORDER — AMISULPRIDE (ANTIEMETIC) 5 MG/2ML IV SOLN: 10.0000 mg | Freq: Once | INTRAVENOUS | Status: DC | PRN

## 2024-03-10 MED ORDER — HEPARIN SODIUM (PORCINE) 1000 UNIT/ML IJ SOLN
INTRAMUSCULAR | Status: AC
  Filled 2024-03-10: qty 10

## 2024-03-10 MED ORDER — LIDOCAINE 2% (20 MG/ML) 5 ML SYRINGE
INTRAMUSCULAR | Status: DC | PRN
  Administered 2024-03-10: 100 mg via INTRAVENOUS

## 2024-03-10 MED ORDER — HYDROMORPHONE HCL 1 MG/ML IJ SOLN: 0.2500 mg | INTRAMUSCULAR | Status: DC | PRN

## 2024-03-10 MED ORDER — DIPHENHYDRAMINE HCL 50 MG/ML IJ SOLN
12.5000 mg | Freq: Four times a day (QID) | INTRAMUSCULAR | Status: DC | PRN
  Administered 2024-03-10: 12.5 mg via INTRAVENOUS
  Filled 2024-03-10: qty 1

## 2024-03-10 MED ORDER — ORAL CARE MOUTH RINSE: 15.0000 mL | Freq: Once | OROMUCOSAL | Status: AC

## 2024-03-10 MED ORDER — FENTANYL CITRATE (PF) 250 MCG/5ML IJ SOLN
INTRAMUSCULAR | Status: DC | PRN
Start: 2024-03-10 — End: 2024-03-10
  Administered 2024-03-10 (×2): 50 ug via INTRAVENOUS
  Administered 2024-03-10: 100 ug via INTRAVENOUS
  Administered 2024-03-10 (×4): 50 ug via INTRAVENOUS

## 2024-03-10 MED ORDER — PROTAMINE SULFATE 10 MG/ML IV SOLN
INTRAVENOUS | Status: DC | PRN
  Administered 2024-03-10 (×3): 10 mg via INTRAVENOUS
  Administered 2024-03-10: 20 mg via INTRAVENOUS

## 2024-03-10 MED ORDER — GLYCOPYRROLATE 0.2 MG/ML IJ SOLN
INTRAMUSCULAR | Status: DC | PRN
  Administered 2024-03-10: .2 mg via INTRAVENOUS

## 2024-03-10 MED ORDER — ONDANSETRON HCL 4 MG/2ML IJ SOLN
INTRAMUSCULAR | Status: AC
Start: 2024-03-10 — End: 2024-03-10
  Filled 2024-03-10: qty 2

## 2024-03-10 MED ORDER — PROPOFOL 10 MG/ML IV BOLUS
INTRAVENOUS | Status: DC | PRN
Start: 2024-03-10 — End: 2024-03-10
  Administered 2024-03-10: 150 mg via INTRAVENOUS
  Administered 2024-03-10 (×2): 50 mg via INTRAVENOUS

## 2024-03-10 MED ORDER — MAGNESIUM SULFATE 2 GM/50ML IV SOLN
2.0000 g | Freq: Every day | INTRAVENOUS | Status: DC | PRN
  Administered 2024-03-10: 2 g via INTRAVENOUS
  Filled 2024-03-10: qty 50

## 2024-03-10 MED ORDER — SODIUM CHLORIDE 0.9% FLUSH: 9.0000 mL | INTRAVENOUS | Status: DC | PRN

## 2024-03-10 MED ORDER — SODIUM CHLORIDE 0.9 % IV SOLN: 500.0000 mL | Freq: Once | INTRAVENOUS | Status: DC | PRN

## 2024-03-10 MED ORDER — CEFAZOLIN SODIUM-DEXTROSE 2-4 GM/100ML-% IV SOLN
2.0000 g | Freq: Three times a day (TID) | INTRAVENOUS | Status: AC
  Administered 2024-03-10 – 2024-03-11 (×2): 2 g via INTRAVENOUS
  Filled 2024-03-10 (×2): qty 100

## 2024-03-10 MED ORDER — OXYCODONE HCL 5 MG PO TABS: 5.0000 mg | ORAL_TABLET | Freq: Once | ORAL | Status: DC | PRN

## 2024-03-10 MED ORDER — SURGIFLO WITH THROMBIN (HEMOSTATIC MATRIX KIT) OPTIME
TOPICAL | Status: DC | PRN
  Administered 2024-03-10: 1 via TOPICAL

## 2024-03-10 MED ORDER — DEXAMETHASONE SOD PHOSPHATE PF 10 MG/ML IJ SOLN
INTRAMUSCULAR | Status: DC | PRN
  Administered 2024-03-10: 10 mg via INTRAVENOUS

## 2024-03-10 SURGICAL SUPPLY — 40 items
BAG COUNTER SPONGE SURGICOUNT (BAG) ×2 IMPLANT
CANISTER SUCTION 3000ML PPV (SUCTIONS) ×2 IMPLANT
CLIP TI MEDIUM 24 (CLIP) ×2 IMPLANT
CLIP TI WIDE RED SMALL 24 (CLIP) ×2 IMPLANT
COVER MAYO STAND STRL (DRAPES) ×2 IMPLANT
DERMABOND ADVANCED .7 DNX12 (GAUZE/BANDAGES/DRESSINGS) ×4 IMPLANT
ELECT BLADE 6.5 EXT (BLADE) IMPLANT
ELECTRODE BLDE 4.0 EZ CLN MEGD (MISCELLANEOUS) ×2 IMPLANT
ELECTRODE REM PT RTRN 9FT ADLT (ELECTROSURGICAL) ×2 IMPLANT
FELT TEFLON 1X6 (MISCELLANEOUS) IMPLANT
GLOVE BIOGEL PI IND STRL 8 (GLOVE) ×2 IMPLANT
GLOVE SURG SS PI 7.5 STRL IVOR (GLOVE) ×2 IMPLANT
GOWN STRL REUS W/ TWL LRG LVL3 (GOWN DISPOSABLE) ×4 IMPLANT
GOWN STRL REUS W/ TWL XL LVL3 (GOWN DISPOSABLE) ×2 IMPLANT
HEMOSTAT SNOW SURGICEL 2X4 (HEMOSTASIS) IMPLANT
INSERT FOGARTY 61MM (MISCELLANEOUS) ×4 IMPLANT
INSERT FOGARTY SM (MISCELLANEOUS) ×8 IMPLANT
KIT BASIN OR (CUSTOM PROCEDURE TRAY) ×2 IMPLANT
KIT TURNOVER KIT B (KITS) ×2 IMPLANT
LOOP VESSEL MINI RED (MISCELLANEOUS) IMPLANT
MARKER SKIN DUAL TIP RULER LAB (MISCELLANEOUS) IMPLANT
PACK AORTA (CUSTOM PROCEDURE TRAY) ×2 IMPLANT
PAD ARMBOARD POSITIONER FOAM (MISCELLANEOUS) ×4 IMPLANT
PUNCH AORTIC ROTATE 5MM 8IN (MISCELLANEOUS) IMPLANT
SOLN 0.9% NACL POUR BTL 1000ML (IV SOLUTION) ×4 IMPLANT
SOLN STERILE WATER BTL 1000 ML (IV SOLUTION) ×4 IMPLANT
SURGIFLO W/THROMBIN 8M KIT (HEMOSTASIS) IMPLANT
SUT ETHIBOND 5 LR DA (SUTURE) IMPLANT
SUT PDS AB 1 TP1 54 (SUTURE) ×4 IMPLANT
SUT PROLENE 3 0 SH 48 (SUTURE) ×6 IMPLANT
SUT PROLENE 5 0 C 1 24 (SUTURE) IMPLANT
SUT PROLENE 5 0 C 1 36 (SUTURE) IMPLANT
SUT SILK 2 0SH CR/8 30 (SUTURE) ×2 IMPLANT
SUT VIC AB 2-0 CT1 TAPERPNT 27 (SUTURE) ×4 IMPLANT
SUT VIC AB 3-0 SH 27X BRD (SUTURE) IMPLANT
SUT VIC AB 4-0 PS2 18 (SUTURE) ×4 IMPLANT
TOWEL GREEN STERILE (TOWEL DISPOSABLE) ×2 IMPLANT
TOWEL ~~LOC~~+RFID 17X26 BLUE (SPONGE) ×4 IMPLANT
TRAY FOLEY MTR SLVR 14FR STAT (SET/KITS/TRAYS/PACK) IMPLANT
TRAY FOLEY MTR SLVR 16FR STAT (SET/KITS/TRAYS/PACK) ×2 IMPLANT

## 2024-03-10 NOTE — H&P (Signed)
 [facility] [place]   Patient name: Russell Walker            MRN: [id number]        DOB: 09/09/2000          Sex: male     REASON FOR VISIT:      Follow-up   HISOTRY OF PRESENT ILLNESS:    Russell Walker is a 23 y.o. 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  The patient states that he has had abdominal pain in his left flank radiating around to his left back since middle school.  He describes this as a stabbing pain and continuous.  He also notes that he has had prior urinalysis that have been positive for blood.  He has not had any prior abdominal surgery.  He does note that there is a family history of pancreatic and renal cancer.  He is a non-smoker.  I sent him for a urinalysis to look for blood.     PAST MEDICAL HISTORY:        Past Medical History:  Diagnosis Date   ADHD              FAMILY HISTORY:    History reviewed. No pertinent family history.       SOCIAL HISTORY:    Social History        Tobacco Use   Smoking status: Former   Smokeless tobacco: Never  Substance Use Topics   Alcohol use: Not Currently        ALLERGIES:    Allergies  No Known Allergies       CURRENT MEDICATIONS:    No current outpatient medications on file.      No current facility-administered medications for this visit.        REVIEW OF SYSTEMS:    [X]  denotes positive finding, [ ]  denotes negative finding Cardiac   Comments:  Chest pain or chest pressure:      Shortness of breath upon exertion:      Short of breath when lying flat:      Irregular heart rhythm:             Vascular      Pain in calf, thigh, or hip brought on by ambulation:      Pain in feet at night that wakes you up from your sleep:       Blood clot in your veins:      Leg swelling:              Pulmonary      Oxygen at home:      Productive cough:       Wheezing:              Neurologic      Sudden  weakness in arms or legs:       Sudden numbness in arms or legs:       Sudden onset of difficulty speaking or slurred speech:      Temporary loss of vision in one eye:       Problems with dizziness:              Gastrointestinal      Blood in stool:       Vomited blood:              Genitourinary      Burning when urinating:  Blood in urine:             Psychiatric      Major depression:              Hematologic      Bleeding problems:      Problems with blood clotting too easily:             Skin      Rashes or ulcers:             Constitutional      Fever or chills:          PHYSICAL EXAM:       Vitals:    01/31/24 1054  BP: 116/65  Pulse: 71  SpO2: 96%  Weight: 208 lb (94.3 kg)  Height: 6' (1.829 m)      GENERAL: The patient is a well-nourished male, in no acute distress. The vital signs are documented above. CARDIAC: There is a regular rate and rhythm.  PULMONARY: Non-labored respirations ABDOMEN: Soft and non-tender  MUSCULOSKELETAL: There are no major deformities or cyanosis. NEUROLOGIC: No focal weakness or paresthesias are detected. SKIN: There are no ulcers or rashes noted. PSYCHIATRIC: The patient has a normal affect.   STUDIES:    UA negative for blood   MEDICAL ISSUES:    Nutcracker syndrome: I think the next step is to get pressure measurements across the renal vein to confirm that there is an actual hemodynamically significant problem and also to look for reflux into the gonadal venous system.  Based on these results we will consider whether or not surgery to correct the issue is necessary.    Venography showed dilated gonadal vein.  Discussed proceeding with left renal vein transposition.  Details of surgery discussed and all questions answered. He wishes to proceed    Malvina Serene CLORE, MD, FACS [facility] [phone] Pager [phone]

## 2024-03-10 NOTE — Anesthesia Procedure Notes (Signed)
 Procedure Name: Intubation Date/Time: 03/10/2024 9:59 AM  Performed by: Carolee Lauraine DASEN, CRNAPre-anesthesia Checklist: Patient identified, Emergency Drugs available, Suction available and Patient being monitored Patient Re-evaluated:Patient Re-evaluated prior to induction Oxygen Delivery Method: Circle System Utilized Preoxygenation: Pre-oxygenation with 100% oxygen Induction Type: IV induction Ventilation: Mask ventilation without difficulty Laryngoscope Size: Miller and 2 Grade View: Grade I Tube type: Oral Tube size: 7.5 mm Number of attempts: 1 Airway Equipment and Method: Stylet and Oral airway Placement Confirmation: ETT inserted through vocal cords under direct vision, positive ETCO2 and breath sounds checked- equal and bilateral Secured at: 23 cm Tube secured with: Tape Dental Injury: Teeth and Oropharynx as per pre-operative assessment

## 2024-03-10 NOTE — Anesthesia Procedure Notes (Signed)
 Arterial Line Insertion Start/End11/14/2025 7:40 AM, 03/10/2024 7:43 AM Performed by: Zelphia Norleen HERO, CRNA, CRNA  Patient location: Pre-op. Preanesthetic checklist: patient identified, IV checked, site marked, risks and benefits discussed, surgical consent, monitors and equipment checked, pre-op evaluation, timeout performed and anesthesia consent Lidocaine 1% used for infiltration Left, radial was placed Catheter size: 20 G Hand hygiene performed  and maximum sterile barriers used   Attempts: 1 Procedure performed using ultrasound to evaluate access site. Ultrasound Notes:relevant anatomy identified, ultrasound used to visualize needle entry and vessel patent under ultrasound. Following insertion, dressing applied and Biopatch. Post procedure assessment: normal and unchanged  Patient tolerated the procedure well with no immediate complications.       Document Information

## 2024-03-10 NOTE — Op Note (Signed)
 Patient name: Russell Walker MRN: [id number] DOB: [birth year] Sex: male  03/10/2024 Pre-operative Diagnosis: Nutcracker syndrome Post-operative diagnosis:  Same Surgeon:  Malvina New Assistants:  KYM Damme, PA Procedure:   #1: Exploratory laparotomy   #2: Transposition of the left renal vein to the inferior vena cava Anesthesia:  general Blood Loss:  minimal Specimens:  none  Findings: The left renal vein was reimplanted 3 cm distal to its original location on the inferior vena cava.  The iliolumbar vein was ligated.  I did not ligate the adrenal vein or the gonadal vein.  Indications: This is a 23 year old gentleman with chronic history of left flank pain as well as hematuria.  CT scan shows a nutcracker compression of the left renal vein between the aorta and the superior mesenteric artery.  This was confirmed with venography.  We discussed proceeding with vein transposition  Procedure:  The patient was identified in the holding area and taken to [facility] OR ROOM 11  The patient was then placed supine on the table. general anesthesia was administered.  The patient was prepped and draped in the usual sterile fashion.  A time out was called and antibiotics were administered.  Due to the complexity of the case, a PA was necessary to expedite the procedure.  She helped with exposure by providing suction and retraction.  She helped with the anastomoses by following the suture.  She helped with wound closure.  A midline incision was made from the xiphoid down to the umbilicus.  Cautery was used to divide the subcutaneous tissue down to the fascia which was opened with cautery.  I sharply entered the abdomen and extended this throughout the length of the incision.  The abdominal contents were inspected.  There was no gross pathology.  A Balfour followed by a Omni-Tract retractor were utilized.  The transverse colon was reflected cephalad.  The small bowel was mobilized to the patient's right.  The  ligament of Treitz was taken down with sharp dissection and cautery.  I protected the inferior mesenteric vein.  I then divided the retroperitoneum exposing the abdominal aorta.  I proceeded with cephalad dissection until I identified the left renal vein.  This was circumferentially exposed.  It was fully mobilized from the inferior vena cava.  I did ligate the left iliolumbar vein between silk ties.  The gonadal vein was visualized and was dilated.  I then proceeded to dissect out the inferior vena cava.  There was a large lymphatic complex with multiple lymph nodes between the aorta and vena cava that had to be moved out of the way.  Once I had adequate exposure, the patient was fully heparinized.  After the heparin circulated, a Satinsky clamp was placed on the vena cava and a Henley clamp on the left renal vein.  The left renal vein was then taken off of the inferior vena cava leaving a small cuff which was oversewn with a 5-0 Prolene in 2 layers.  The Satinsky clamp was then released and this was hemostatic.  I then selected a site 3 cm distal to the original origin of the left renal vein.  A Satinsky clamp was placed at this level and a #11 blade was used to make a venotomy.  I used a #5 punch to further open the venotomy which was approximately 1-1/2 cm.  I then performed a running anastomosis between the left renal vein and the inferior vena cava with 5-0 Prolene.  Prior to completion, the appropriate  flushing maneuvers were performed.  I had anesthesia perform a Valsalva when I di clamped.  Hand-held Doppler was used to evaluate signals in the vena cava and left renal vein which were appropriate.  The patient's heparin was reversed with 50 mg of protamine.  Once hemostasis was satisfactory the retroperitoneum was reapproximated 3-0 Vicryl.  The small bowel was placed back into its anatomic position.  The bowel was run again and there was no abnormalities.  The fascia was then closed with 2 running #1 PDS  suture.  The subtenons tissue was closed with 2-0 Vicryl and the skin was closed with 4-0 Vicryl followed Dermabond.  There were no immediate complications.  He was successfully taken to the recovery room in stable condition.   Disposition: To PACU stable.   ALONSO Malvina New, M.D., [facility] [place] Office: [phone] Pager:  [phone]

## 2024-03-10 NOTE — Anesthesia Preprocedure Evaluation (Addendum)
 Anesthesia Evaluation  Patient identified by MRN, date of birth, ID band Patient awake    Reviewed: Allergy & Precautions, NPO status , Patient's Chart, lab work & pertinent test results  History of Anesthesia Complications Negative for: history of anesthetic complications  Airway Mallampati: I  TM Distance: >3 FB Neck ROM: Full    Dental  (+) Teeth Intact, Dental Advisory Given   Pulmonary former smoker   breath sounds clear to auscultation       Cardiovascular  Rhythm:Regular Rate:Normal     Neuro/Psych negative neurological ROS     GI/Hepatic   Endo/Other    Renal/GU      Musculoskeletal negative musculoskeletal ROS (+)    Abdominal   Peds  Hematology   Anesthesia Other Findings   Reproductive/Obstetrics                              Anesthesia Physical Anesthesia Plan  ASA: 1  Anesthesia Plan: General and Regional   Post-op Pain Management:    Induction: Intravenous  PONV Risk Score and Plan: 2 and Ondansetron, Dexamethasone, Treatment may vary due to age or medical condition and Midazolam  Airway Management Planned: Oral ETT  Additional Equipment: Arterial line  Intra-op Plan:   Post-operative Plan:   Informed Consent:      Dental advisory given  Plan Discussed with: CRNA  Anesthesia Plan Comments:          Anesthesia Quick Evaluation

## 2024-03-10 NOTE — Plan of Care (Signed)
  Add All Education: Knowledge of General Education information will improve Add Today at 2204 - Progressing by Rolfe Corean HERO, RN Add Health Behavior/Discharge Planning: Ability to manage health-related needs will improve Add Today at 2204 - Progressing by Rolfe Corean HERO, RN Add Clinical Measurements: Ability to maintain clinical measurements within normal limits will improve Add Today at 2204 - Progressing by Rolfe Corean HERO, RN Add Will remain free from infection Add Today at 2204 - Progressing by Rolfe Corean HERO, RN Add Diagnostic test results will improve Add Today at 2204 - Progressing by Rolfe Corean HERO, RN Add Respiratory complications will improve Add Today at 2204 - Progressing by Rolfe Corean HERO, RN Add Cardiovascular complication will be avoided Add Today at 2204 - Progressing by Rolfe Corean HERO, RN Add Activity: Risk for activity intolerance will decrease Add Today at 2204 - Progressing by Rolfe Corean HERO, RN Add Nutrition: Adequate nutrition will be maintained Add Today at 2204 - Progressing by Rolfe Corean HERO, RN Add Coping: Level of anxiety will decrease Add Today at 2204 - Progressing by Rolfe Corean HERO, RN Add Elimination: Will not experience complications related to bowel motility Add Today at 2204 - Progressing by Rolfe Corean HERO, RN Add Will not experience complications related to urinary retention Add Today at 2204 - Progressing by Rolfe Corean HERO, RN Add Pain Managment: General experience of comfort will improve and/or be controlled Add Today at 2204 - Progressing by Rolfe Corean HERO, RN Add Safety: Ability to remain free from injury will improve Add Today at 2204 - Progressing by Rolfe Corean HERO, RN Add Skin Integrity: Risk for impaired skin integrity will decrease Add Today at 2204 - Progressing by Rolfe Corean HERO,  RN

## 2024-03-10 NOTE — Progress Notes (Signed)
 NG tube inadvertently dislodged by patient. No drainage collected prior. Dc'd NG tube and confirmed with Dr. Sheree to not replace unless patient develops nausea.

## 2024-03-10 NOTE — Transfer of Care (Signed)
 Immediate Anesthesia Transfer of Care Note  Patient: Russell Walker  Procedure(s) Performed: ANEURYSM ABDOMINAL AORTIC REPAIR TRANSPOSITION, VEIN, RENAL (Left)  Patient Location: PACU  Anesthesia Type:General  Level of Consciousness: awake and alert   Airway & Oxygen Therapy: Patient Spontanous Breathing and Patient connected to nasal cannula oxygen  Post-op Assessment: Report given to RN, Post -op Vital signs reviewed and stable, and Patient moving all extremities X 4  Post vital signs: Reviewed and stable  Last Vitals:  Vitals Value Taken Time  BP 140/75 03/10/24 13:15  Temp 36.9 C 03/10/24 13:03  Pulse 76 03/10/24 13:16  Resp 13 03/10/24 13:16  SpO2 98 % 03/10/24 13:16  Vitals shown include unfiled device data.  Last Pain:  Vitals:   03/10/24 0741  PainSc: 0-No pain      Patients Stated Pain Goal: 0 (03/10/24 0741)  Complications: No notable events documented.

## 2024-03-11 DIAGNOSIS — Z9889 Other specified postprocedural states: Secondary | ICD-10-CM

## 2024-03-11 LAB — BASIC METABOLIC PANEL WITH GFR
Anion gap: 12 (ref 5–15)
BUN: 10 mg/dL (ref 6–20)
CO2: 21 mmol/L — ABNORMAL LOW (ref 22–32)
Calcium: 8.6 mg/dL — ABNORMAL LOW (ref 8.9–10.3)
Chloride: 105 mmol/L (ref 98–111)
Creatinine, Ser: 1 mg/dL (ref 0.61–1.24)
GFR, Estimated: >60 mL/min (ref >60)
Glucose, Bld: 118 mg/dL — ABNORMAL HIGH (ref 70–99)
Potassium: 4.3 mmol/L (ref 3.5–5.1)
Sodium: 138 mmol/L (ref 135–145)

## 2024-03-11 LAB — CBC
HCT: 43.3 % (ref 39.0–52.0)
Hemoglobin: 14.2 g/dL (ref 13.0–17.0)
MCH: 29.9 pg (ref 26.0–34.0)
MCHC: 32.8 g/dL (ref 30.0–36.0)
MCV: 91.2 fL (ref 80.0–100.0)
Platelets: 302 K/uL (ref 150–400)
RBC: 4.75 MIL/uL (ref 4.22–5.81)
RDW: 12.9 % (ref 11.5–15.5)
WBC: 12.9 K/uL — ABNORMAL HIGH (ref 4.0–10.5)
nRBC: 0 % (ref 0.0–0.2)

## 2024-03-11 LAB — LIPASE, BLOOD: Lipase: 25 U/L (ref 11–51)

## 2024-03-11 LAB — MAGNESIUM: Magnesium: 2.2 mg/dL (ref 1.7–2.4)

## 2024-03-11 LAB — AMYLASE: Amylase: 21 U/L — ABNORMAL LOW (ref 28–100)

## 2024-03-11 MED ORDER — CHLORHEXIDINE GLUCONATE CLOTH 2 % EX PADS
6.0000 | MEDICATED_PAD | Freq: Every day | CUTANEOUS | Status: DC
  Administered 2024-03-11 – 2024-03-12 (×2): 6 via TOPICAL

## 2024-03-11 NOTE — Evaluation (Signed)
 Physical Therapy Evaluation Patient Details Name: Russell Walker MRN: [id number] DOB: 11-Apr-2001 Today's Date: 03/11/2024  History of Present Illness  23 y.o. male admitted 11/14 and underwent Exploratory laparotomy                        with Transposition of the left renal vein to the inferior vena cava. PMH ADHD and left renal vein nutcracker syndrome.  Clinical Impression  Patient is s/p above surgery resulting in functional limitations due to the deficits listed below (see PT Problem List). 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 Requires supervision for bed mobility, transfer and gait up to 195 feet today without an assistive device. VSS. Guarded with movement due to pain throughout abdominal incision. Educated on safe mobility techniques to minimize discomfort. Encouraged OOB often and mobility with staff as tolerated several times per day. Will have good support at d/c. Do not anticipate any PT follow-up needs but educated on resources if he feels that he is slow to recover. Patient will benefit from acute skilled PT to increase their independence and safety with mobility to facilitate discharge.         If plan is discharge home, recommend the following: Assistance with cooking/housework;Assist for transportation;Help with stairs or ramp for entrance   Can travel by private vehicle        Equipment Recommendations None recommended by PT  Recommendations for Other Services       Functional Status Assessment Patient has had a recent decline in their functional status and demonstrates the ability to make significant improvements in function in a reasonable and predictable amount of time.     Precautions / Restrictions Precautions Precautions: None Recall of Precautions/Restrictions: Intact Restrictions Weight Bearing Restrictions Per Provider Order: No      Mobility  Bed Mobility Overal bed mobility: Needs Assistance Bed Mobility: Rolling, Sidelying  to Sit, Sit to Sidelying Rolling: Supervision Sidelying to sit: Supervision     Sit to sidelying: Supervision General bed mobility comments: Instructions for technique to minimize rectus abdominus contraction. No assist needed. Slow and guarded.    Transfers Overall transfer level: Needs assistance Equipment used: None Transfers: Sit to/from Stand Sit to Stand: Supervision           General transfer comment: Supervision for safety, slow to rise, painful, gradually able to stand fully upright.    Ambulation/Gait Ambulation/Gait assistance: Supervision Gait Distance (Feet): 195 Feet Assistive device: None Gait Pattern/deviations: Step-through pattern, Decreased stride length Gait velocity: dec Gait velocity interpretation: 1.31 - 2.62 ft/sec, indicative of limited community ambulator   General Gait Details: Grossly stable with reduced pace and step length. No overt LOB or buckling noted. No dyspnea, SpO2 100% on RA. Denies dizziness. Supervision for safety.  Stairs            Wheelchair Mobility     Tilt Bed    Modified Rankin (Stroke Patients Only)       Balance Overall balance assessment: Modified Independent                                           Pertinent Vitals/Pain Pain Assessment Pain Assessment: 0-10 Pain Score: 5  Pain Descriptors / Indicators: Aching, Operative site guarding Pain Intervention(s): Monitored during session, Repositioned, Limited activity within patient's tolerance, PCA encouraged    Home Living Family/patient expects  to be discharged to:: Private residence Living Arrangements: Parent Available Help at Discharge: Available PRN/intermittently;Family Type of Home: House Home Access: Stairs to enter Entrance Stairs-Rails: Right;Left;Can reach both Entrance Stairs-Number of Steps: 3 Alternate Level Stairs-Number of Steps: 18 Home Layout: Two level;Full bath on main level;Able to live on main level with  bedroom/bathroom;Bed/bath upstairs Home Equipment: None      Prior Function Prior Level of Function : Independent/Modified Independent;Working/employed;Driving             Mobility Comments: ind ADLs Comments: ind; manual labor     Extremity/Trunk Assessment   Upper Extremity Assessment Upper Extremity Assessment: Defer to OT evaluation    Lower Extremity Assessment Lower Extremity Assessment: Overall WFL for tasks assessed (guarded from pain only.)       Communication   Communication Communication: No apparent difficulties    Cognition Arousal: Alert Behavior During Therapy: WFL for tasks assessed/performed   PT - Cognitive impairments: No apparent impairments                         Following commands: Intact       Cueing Cueing Techniques: Verbal cues     General Comments General comments (skin integrity, edema, etc.): VSS throughout on RA.    Exercises General Exercises - Lower Extremity Ankle Circles/Pumps: AROM, Both, 20 reps, Supine   Assessment/Plan    PT Assessment Patient needs continued PT services  PT Problem List Decreased range of motion;Decreased activity tolerance;Decreased mobility;Pain       PT Treatment Interventions DME instruction;Gait training;Stair training;Therapeutic activities;Functional mobility training;Therapeutic exercise;Balance training;Neuromuscular re-education;Patient/family education;Modalities    PT Goals (Current goals can be found in the Care Plan section)  Acute Rehab PT Goals Patient Stated Goal: Get well return home PT Goal Formulation: With patient Time For Goal Achievement: 03/25/24 Potential to Achieve Goals: Good    Frequency Min 2X/week     Co-evaluation               AM-PAC PT 6 Clicks Mobility  Outcome Measure Help needed turning from your back to your side while in a flat bed without using bedrails?: A Little Help needed moving from lying on your back to sitting on the side  of a flat bed without using bedrails?: A Little Help needed moving to and from a bed to a chair (including a wheelchair)?: A Little Help needed standing up from a chair using your arms (e.g., wheelchair or bedside chair)?: A Little Help needed to walk in hospital room?: A Little Help needed climbing 3-5 steps with a railing? : A Little 6 Click Score: 18    End of Session   Activity Tolerance: Patient tolerated treatment well Patient left: in bed;with call bell/phone within reach;with family/visitor present   PT Visit Diagnosis: Other abnormalities of gait and mobility (R26.89);Pain Pain - part of body:  (abdomen)    Time: 8875-8854 PT Time Calculation (min) (ACUTE ONLY): 21 min   Charges:   PT Evaluation $PT Eval Low Complexity: 1 Low   PT General Charges $$ ACUTE PT VISIT: 1 Visit         Leontine Roads, PT, DPT [facility]  Rehabilitation Services Physical Therapist Office: [phone] Website: [email or web address]   Leontine GORMAN Roads 03/11/2024, 12:39 PM

## 2024-03-11 NOTE — Progress Notes (Signed)
  Progress Note    03/11/2024 10:50 AM 1 Day Post-Op  Subjective: NG tube accidentally removed, no bowel function yet, does not feel distended  Vitals:   03/11/24 0900 03/11/24 1006  BP: 126/75 111/66  Pulse: 75 89  Resp: 18   Temp:  99 F (37.2 C)  SpO2: 97% 99%    Physical Exam: Awake alert and oriented Nonlabored respirations Abdomen is soft and appropriately tender, midline incision healing well  CBC    Component Value Date/Time   WBC 12.9 (H) 03/11/2024 0141   RBC 4.75 03/11/2024 0141   HGB 14.2 03/11/2024 0141   HCT 43.3 03/11/2024 0141   PLT 302 03/11/2024 0141   MCV 91.2 03/11/2024 0141   MCH 29.9 03/11/2024 0141   MCHC 32.8 03/11/2024 0141   RDW 12.9 03/11/2024 0141   LYMPHSABS 2.1 05/14/2015 1610   MONOABS 0.9 05/14/2015 1610   EOSABS 0.0 05/14/2015 1610   BASOSABS 0.1 05/14/2015 1610    BMET    Component Value Date/Time   NA 138 03/11/2024 0141   K 4.3 03/11/2024 0141   CL 105 03/11/2024 0141   CO2 21 (L) 03/11/2024 0141   GLUCOSE 118 (H) 03/11/2024 0141   BUN 10 03/11/2024 0141   CREATININE 1.00 03/11/2024 0141   CALCIUM 8.6 (L) 03/11/2024 0141   GFRNONAA >60 03/11/2024 0141   GFRAA >60 05/23/2019 0711    INR    Component Value Date/Time   INR 1.3 (H) 03/10/2024 1412     Intake/Output Summary (Last 24 hours) at 03/11/2024 1050 Last data filed at 03/11/2024 0800 Gross per 24 hour  Intake 3762.96 ml  Output 2500 ml  Net 1262.96 ml     Assessment:  23 y.o. male is postop day 1 left renal vein transposition.  Plan: NG tube was removed we will plan for sips and chips today  Out of bed as tolerated  Transfer to floor.   Ladye Macnaughton C. Sheree, MD [facility] [place] Office: [phone] Pager: [phone]  03/11/2024 10:50 AM

## 2024-03-11 NOTE — Progress Notes (Signed)
 Hydromorphone PCA pump with ETCO2 monitoring transferred with patient to 4E. Settings/dosage confirmed with Information Systems Manager at bedside. 26 mls in syringe and dosing history confirmed.

## 2024-03-11 NOTE — Progress Notes (Signed)
 Mobility Specialist Progress Note:    03/11/24 1500  Mobility  Activity Ambulated with assistance (To BR)  Level of Assistance Standby assist, set-up cues, supervision of patient - no hands on  Assistive Device None  Distance Ambulated (ft) 20 ft  Activity Response Tolerated well  Mobility Referral Yes  Mobility visit 1 Mobility  Mobility Specialist Start Time (ACUTE ONLY) 1450  Mobility Specialist Stop Time (ACUTE ONLY) 1500  Mobility Specialist Time Calculation (min) (ACUTE ONLY) 10 min   Received pt requesting assistance to the BR. Found pt on 2 L/min O2. Pt required no physical assistance. C/o abdominal pain, otherwise tolerated well. Pt voided. Returned pt to bed. Left pt on 2 L/min O2. Personal belongings and call light within reach. All needs met.  Lavanda Pollack Mobility Specialist  Please contact via [employer] or  Rehab Office [phone]

## 2024-03-11 NOTE — Evaluation (Signed)
 Occupational Therapy Evaluation Patient Details Name: Russell Walker MRN: [id number] DOB: 2000/12/04 Today's Date: 03/11/2024   History of Present Illness   23 y.o. male admitted 11/14 and underwent Exploratory laparotomy                        with Transposition of the left renal vein to the inferior vena cava. PMH ADHD and left renal vein nutcracker syndrome.     Clinical Impressions Pt admitted based on above, and was seen based on problem list below. PTA pt was independent with ADLs and IADLs. Today pt is at baseline for ADLs, and supervision for mobility. Pt primarily limited by pain in lower abdomen. Educated pt on use of compensatory strategies for ADLs and mobility to reduce use of abdominal muscles, in hopes of reducing pain. Pt able to return demo understanding of education provided. Encouraged frequent mobility to reduce risk of deconditioning. No follow up OT needs. All education complete, no further acute OT needs. OT is signing off on this pt.     If plan is discharge home, recommend the following:   Assistance with cooking/housework     Functional Status Assessment   Patient has not had a recent decline in their functional status     Equipment Recommendations   None recommended by OT      Precautions/Restrictions   Precautions Precautions: None Recall of Precautions/Restrictions: Intact Restrictions Weight Bearing Restrictions Per Provider Order: No     Mobility Bed Mobility Overal bed mobility: Needs Assistance Bed Mobility: Rolling, Sidelying to Sit, Sit to Sidelying Rolling: Supervision Sidelying to sit: Supervision     Sit to sidelying: Supervision General bed mobility comments: Cueing to reinforce rolling    Transfers Overall transfer level: Needs assistance Equipment used: None Transfers: Sit to/from Stand Sit to Stand: Supervision           General transfer comment: S for safety, cue'd pt on use of rocking for momentum to  reduce use of abdominal muscles      Balance Overall balance assessment: Modified Independent     ADL either performed or assessed with clinical judgement   ADL Overall ADL's : Modified independent;At baseline       General ADL Comments: Educated and demo'd use of compensatory strategies to manage pain     Vision Baseline Vision/History: 0 No visual deficits Patient Visual Report: No change from baseline Vision Assessment?: No apparent visual deficits            Pertinent Vitals/Pain Pain Assessment Pain Assessment: 0-10 Pain Score: 5  Pain Descriptors / Indicators: Aching, Operative site guarding Pain Intervention(s): Limited activity within patient's tolerance     Extremity/Trunk Assessment Upper Extremity Assessment Upper Extremity Assessment: Overall WFL for tasks assessed   Lower Extremity Assessment Lower Extremity Assessment: Defer to PT evaluation       Communication Communication Communication: No apparent difficulties   Cognition Arousal: Alert Behavior During Therapy: WFL for tasks assessed/performed Cognition: No apparent impairments       Following commands: Intact       Cueing  General Comments   Cueing Techniques: Verbal cues  VSS on RA           Home Living Family/patient expects to be discharged to:: Private residence Living Arrangements: Parent Available Help at Discharge: Available PRN/intermittently;Family Type of Home: House Home Access: Stairs to enter [employer] of Steps: 3 Entrance Stairs-Rails: Right;Left;Can reach both Home Layout: Two level;Full bath on main  level;Able to live on main level with bedroom/bathroom;Bed/bath upstairs Alternate Level Stairs-Number of Steps: 18 Alternate Level Stairs-Rails: Right Bathroom Shower/Tub: Chief Strategy Officer: Standard     Home Equipment: None          Prior Functioning/Environment Prior Level of Function : Independent/Modified  Independent;Working/employed;Driving             Mobility Comments: ind ADLs Comments: ind; manual labor    OT Problem List: Pain        OT Goals(Current goals can be found in the care plan section)   Acute Rehab OT Goals Patient Stated Goal: To get better OT Goal Formulation: All assessment and education complete, DC therapy Time For Goal Achievement: 03/25/24 Potential to Achieve Goals: Good   AM-PAC OT 6 Clicks Daily Activity     Outcome Measure Help from another person eating meals?: None Help from another person taking care of personal grooming?: None Help from another person toileting, which includes using toliet, bedpan, or urinal?: None Help from another person bathing (including washing, rinsing, drying)?: None Help from another person to put on and taking off regular upper body clothing?: None Help from another person to put on and taking off regular lower body clothing?: None 6 Click Score: 24   End of Session Nurse Communication: Mobility status  Activity Tolerance: Patient tolerated treatment well Patient left: in bed;with call bell/phone within reach  OT Visit Diagnosis: Pain Pain - part of body:  (abdomen)                Time: 1515-1530 OT Time Calculation (min): 15 min Charges:  OT General Charges $OT Visit: 1 Visit OT Evaluation $OT Eval Low Complexity: 1 Low  Adrianne BROCKS, OT  Acute Rehabilitation Services Office [phone] Secure chat preferred   Adrianne GORMAN Savers 03/11/2024, 3:40 PM

## 2024-03-12 LAB — CBC
HCT: 44.5 % (ref 39.0–52.0)
Hemoglobin: 14.5 g/dL (ref 13.0–17.0)
MCH: 29.7 pg (ref 26.0–34.0)
MCHC: 32.6 g/dL (ref 30.0–36.0)
MCV: 91.2 fL (ref 80.0–100.0)
Platelets: 282 K/uL (ref 150–400)
RBC: 4.88 MIL/uL (ref 4.22–5.81)
RDW: 13.1 % (ref 11.5–15.5)
WBC: 12.1 K/uL — ABNORMAL HIGH (ref 4.0–10.5)
nRBC: 0 % (ref 0.0–0.2)

## 2024-03-12 LAB — BASIC METABOLIC PANEL WITH GFR
Anion gap: 13 (ref 5–15)
BUN: 9 mg/dL (ref 6–20)
CO2: 24 mmol/L (ref 22–32)
Calcium: 9.2 mg/dL (ref 8.9–10.3)
Chloride: 100 mmol/L (ref 98–111)
Creatinine, Ser: 1.05 mg/dL (ref 0.61–1.24)
GFR, Estimated: >60 mL/min (ref >60)
Glucose, Bld: 97 mg/dL (ref 70–99)
Potassium: 4 mmol/L (ref 3.5–5.1)
Sodium: 137 mmol/L (ref 135–145)

## 2024-03-12 LAB — MAGNESIUM: Magnesium: 1.7 mg/dL (ref 1.7–2.4)

## 2024-03-12 MED ORDER — ACETAMINOPHEN 325 MG PO TABS
650.0000 mg | ORAL_TABLET | Freq: Four times a day (QID) | ORAL | Status: DC | PRN
  Administered 2024-03-13: 650 mg via ORAL
  Filled 2024-03-12: qty 2

## 2024-03-12 MED ORDER — KETOROLAC TROMETHAMINE 30 MG/ML IJ SOLN
30.0000 mg | Freq: Three times a day (TID) | INTRAMUSCULAR | Status: AC | PRN
  Administered 2024-03-12: 30 mg via INTRAVENOUS
  Filled 2024-03-12: qty 1

## 2024-03-12 MED ORDER — OXYCODONE HCL 5 MG PO TABS: 5.0000 mg | ORAL_TABLET | ORAL | Status: DC | PRN

## 2024-03-12 NOTE — Plan of Care (Signed)

## 2024-03-12 NOTE — Progress Notes (Addendum)
  Progress Note    03/12/2024 7:58 AM 2 Days Post-Op  Subjective:  Burping a lot but not passing flatus or having BM yet. Tolerated ambulating yesterday pretty well. He says pain is overall been about the same. Most when he is trying to sit up or stand up or engage his abdominal muscles. Says he is very hungry   Vitals:   03/12/24 0515 03/12/24 0739  BP:  125/70  Pulse:    Resp: 18   Temp:  99.5 F (37.5 C)  SpO2:  98%   Physical Exam: Cardiac:  regular Lungs:  non labored Incisions:  midline incision is intact and very well appearing Extremities: well perfused Abdomen:  tenderness as expected, soft Neurologic: alert and oriented   CBC    Component Value Date/Time   WBC 12.1 (H) 03/12/2024 0401   RBC 4.88 03/12/2024 0401   HGB 14.5 03/12/2024 0401   HCT 44.5 03/12/2024 0401   PLT 282 03/12/2024 0401   MCV 91.2 03/12/2024 0401   MCH 29.7 03/12/2024 0401   MCHC 32.6 03/12/2024 0401   RDW 13.1 03/12/2024 0401   LYMPHSABS 2.1 05/14/2015 1610   MONOABS 0.9 05/14/2015 1610   EOSABS 0.0 05/14/2015 1610   BASOSABS 0.1 05/14/2015 1610    BMET    Component Value Date/Time   NA 137 03/12/2024 0401   K 4.0 03/12/2024 0401   CL 100 03/12/2024 0401   CO2 24 03/12/2024 0401   GLUCOSE 97 03/12/2024 0401   BUN 9 03/12/2024 0401   CREATININE 1.05 03/12/2024 0401   CALCIUM 9.2 03/12/2024 0401   GFRNONAA >60 03/12/2024 0401   GFRAA >60 05/23/2019 0711    INR    Component Value Date/Time   INR 1.3 (H) 03/10/2024 1412     Intake/Output Summary (Last 24 hours) at 03/12/2024 0758 Last data filed at 03/11/2024 2353 Gross per 24 hour  Intake 392.04 ml  Output --  Net 392.04 ml     Assessment/Plan:  23 y.o. male is s/p left renal vein transposition  2 Days Post-Op   Burping a lot Not passing any flatus, no BM Hopefully can start clears later today Will try to transition off of PCA Hemodynamically stable  Continue to mobilize as tolerated   Teretha Damme,  PA-C [facility] [phone] 03/12/2024 7:58 AM I have independently interviewed and examined patient and agree with PA assessment and plan above.   Micky Overturf C. Sheree, MD [facility]  [place] Office: [phone] Pager: [phone]

## 2024-03-12 NOTE — Plan of Care (Signed)

## 2024-03-13 ENCOUNTER — Encounter (HOSPITAL_COMMUNITY): Payer: Self-pay | Admitting: Surgery

## 2024-03-13 LAB — CBC
HCT: 42 % (ref 39.0–52.0)
Hemoglobin: 14.4 g/dL (ref 13.0–17.0)
MCH: 30.5 pg (ref 26.0–34.0)
MCHC: 34.3 g/dL (ref 30.0–36.0)
MCV: 89 fL (ref 80.0–100.0)
Platelets: 284 K/uL (ref 150–400)
RBC: 4.72 MIL/uL (ref 4.22–5.81)
RDW: 12.4 % (ref 11.5–15.5)
WBC: 10.3 K/uL (ref 4.0–10.5)
nRBC: 0 % (ref 0.0–0.2)

## 2024-03-13 LAB — BASIC METABOLIC PANEL WITH GFR
Anion gap: 13 (ref 5–15)
BUN: 14 mg/dL (ref 6–20)
CO2: 27 mmol/L (ref 22–32)
Calcium: 9.6 mg/dL (ref 8.9–10.3)
Chloride: 98 mmol/L (ref 98–111)
Creatinine, Ser: 1.17 mg/dL (ref 0.61–1.24)
GFR, Estimated: >60 mL/min (ref >60)
Glucose, Bld: 86 mg/dL (ref 70–99)
Potassium: 3.9 mmol/L (ref 3.5–5.1)
Sodium: 138 mmol/L (ref 135–145)

## 2024-03-13 LAB — MAGNESIUM: Magnesium: 1.9 mg/dL (ref 1.7–2.4)

## 2024-03-13 MED FILL — Heparin Sodium (Porcine) Inj 1000 Unit/ML: INTRAMUSCULAR | Qty: 30 | Status: AC

## 2024-03-13 MED FILL — Sodium Chloride IV Soln 0.9%: INTRAVENOUS | Qty: 3000 | Status: AC

## 2024-03-13 NOTE — Progress Notes (Addendum)
  Progress Note    03/13/2024 7:39 AM 3 Days Post-Op  Subjective:  feeling a little better this morning in regard to pain. Did not ambulate until late yesterday evening. Tolerating clears. Passing some gas now   Vitals:   03/12/24 2350 03/13/24 0435  BP: 136/87 127/75  Pulse: (!) 104 91  Resp: 17 13  Temp: 99.3 F (37.4 C) 98.4 F (36.9 C)  SpO2: 98% 98%   Physical Exam: Cardiac:  regular Lungs:  non labored Incisions:  midline incision intact and well appearing Extremities:  well perfused and warm Abdomen:  expected tenderness, soft Neurologic: alert and oriented   CBC    Component Value Date/Time   WBC 10.3 03/13/2024 0427   RBC 4.72 03/13/2024 0427   HGB 14.4 03/13/2024 0427   HCT 42.0 03/13/2024 0427   PLT 284 03/13/2024 0427   MCV 89.0 03/13/2024 0427   MCH 30.5 03/13/2024 0427   MCHC 34.3 03/13/2024 0427   RDW 12.4 03/13/2024 0427   LYMPHSABS 2.1 05/14/2015 1610   MONOABS 0.9 05/14/2015 1610   EOSABS 0.0 05/14/2015 1610   BASOSABS 0.1 05/14/2015 1610    BMET    Component Value Date/Time   NA 138 03/13/2024 0427   K 3.9 03/13/2024 0427   CL 98 03/13/2024 0427   CO2 27 03/13/2024 0427   GLUCOSE 86 03/13/2024 0427   BUN 14 03/13/2024 0427   CREATININE 1.17 03/13/2024 0427   CALCIUM 9.6 03/13/2024 0427   GFRNONAA >60 03/13/2024 0427   GFRAA >60 05/23/2019 0711    INR    Component Value Date/Time   INR 1.3 (H) 03/10/2024 1412    No intake or output data in the 24 hours ending 03/13/24 0739   Assessment/Plan:  23 y.o. male is s/p left renal vein transposition 3 Days Post-Op   Off PCA. Pain well controlled on current regimen Passing Flatus now, no BM Tolerated clears. Hopefully can advance to soft diet later today Labs all reassuring Continue to mobilize as tolerated  Teretha Damme, PA-C [facility] [phone] 03/13/2024 7:39 AM   I agree with the above.  I have seen and evaluated the patient.  I am starting him  on a regular diet.  I took him off cardiac telemetry as well as continuous oxygen monitoring.  I encouraged him to ambulate is much as possible.  Hopefully we can talk about discharge home tomorrow  Malvina New

## 2024-03-13 NOTE — Progress Notes (Signed)
 Physical Therapy Treatment Patient Details Name: Russell Walker MRN: [id number] DOB: 10-26-2000 Today's Date: 03/13/2024   History of Present Illness 23 y.o. male admitted 11/14 and underwent Exploratory laparotomy                        with Transposition of the left renal vein to the inferior vena cava. PMH ADHD and left renal vein nutcracker syndrome.    PT Comments  Excellent functional progress. Ambulating with supervision up to 275 feet today, grossly stable without AD. Able to navigate steps safely without issues after education. Feels confident with this task. Encouraged OOB to ambulate with staff multiple times per day. Requested mobility techs increase visits as able. Patient will continue to benefit from skilled physical therapy services to further improve independence with functional mobility.     If plan is discharge home, recommend the following: Assistance with cooking/housework;Assist for transportation;Help with stairs or ramp for entrance   Can travel by private vehicle        Equipment Recommendations  None recommended by PT    Recommendations for Other Services       Precautions / Restrictions Precautions Precautions: None Recall of Precautions/Restrictions: Intact Restrictions Weight Bearing Restrictions Per Provider Order: No     Mobility  Bed Mobility Overal bed mobility: Modified Independent             General bed mobility comments: Mod I a little extra time due to soreness but no assist needed.    Transfers Overall transfer level: Needs assistance Equipment used: None Transfers: Sit to/from Stand Sit to Stand: Supervision           General transfer comment: Supervision for safety, slow to rise but stable, no AD needed.    Ambulation/Gait Ambulation/Gait assistance: Supervision Gait Distance (Feet): 275 Feet Assistive device: None Gait Pattern/deviations: Step-through pattern, Decreased stride length Gait velocity: dec Gait  velocity interpretation: 1.31 - 2.62 ft/sec, indicative of limited community ambulator   General Gait Details: A little slow and guarded but with adequate stability, no overt LOB, supervision for safety. HR 100s.   Stairs Stairs: Yes Stairs assistance: Modified independent (Device/Increase time) Stair Management: One rail Left, Step to pattern, Forwards Number of Stairs: 13 General stair comments: Educated on technique and sequencing, performed with single rail, step to pattern, similar set-up to described home. Feels confident with task, Mod I.   Wheelchair Mobility     Tilt Bed    Modified Rankin (Stroke Patients Only)       Balance Overall balance assessment: Modified Independent                                          Communication Communication Communication: No apparent difficulties  Cognition Arousal: Alert Behavior During Therapy: WFL for tasks assessed/performed   PT - Cognitive impairments: No apparent impairments                         Following commands: Intact      Cueing Cueing Techniques: Verbal cues  Exercises      General Comments General comments (skin integrity, edema, etc.): VSS      Pertinent Vitals/Pain Pain Assessment Pain Assessment: Faces Faces Pain Scale: Hurts a little bit Pain Descriptors / Indicators: Operative site guarding Pain Intervention(s): Limited activity within patient's tolerance, Monitored during session,  Repositioned    Home Living                          Prior Function            PT Goals (current goals can now be found in the care plan section) Acute Rehab PT Goals Patient Stated Goal: Get well return home PT Goal Formulation: With patient Time For Goal Achievement: 03/25/24 Potential to Achieve Goals: Good Progress towards PT goals: Progressing toward goals    Frequency    Min 2X/week      PT Plan      Co-evaluation              AM-PAC PT 6  Clicks Mobility   Outcome Measure  Help needed turning from your back to your side while in a flat bed without using bedrails?: None Help needed moving from lying on your back to sitting on the side of a flat bed without using bedrails?: None Help needed moving to and from a bed to a chair (including a wheelchair)?: A Little Help needed standing up from a chair using your arms (e.g., wheelchair or bedside chair)?: A Little Help needed to walk in hospital room?: A Little Help needed climbing 3-5 steps with a railing? : None 6 Click Score: 21    End of Session   Activity Tolerance: Patient tolerated treatment well Patient left: in bed;with call bell/phone within reach;with family/visitor present   PT Visit Diagnosis: Other abnormalities of gait and mobility (R26.89);Pain Pain - part of body:  (abdomen)     Time: 8862-8851 PT Time Calculation (min) (ACUTE ONLY): 11 min  Charges:    $Gait Training: 8-22 mins PT General Charges $$ ACUTE PT VISIT: 1 Visit                     Russell Roads, PT, DPT [facility]  Rehabilitation Services Physical Therapist Office: [phone] Website: [email or web address]    Russell Walker Roads 03/13/2024, 1:17 PM

## 2024-03-13 NOTE — Anesthesia Postprocedure Evaluation (Signed)
 Anesthesia Post Note  Patient: Russell Walker  Procedure(s) Performed: ANEURYSM ABDOMINAL AORTIC REPAIR TRANSPOSITION, VEIN, RENAL (Left)     Patient location during evaluation: PACU Anesthesia Type: Regional Level of consciousness: awake Pain management: pain level controlled Vital Signs Assessment: post-procedure vital signs reviewed and stable Respiratory status: spontaneous breathing Cardiovascular status: blood pressure returned to baseline Postop Assessment: no apparent nausea or vomiting Anesthetic complications: no   No notable events documented.                Lauraine KATHEE [place]

## 2024-03-14 LAB — BPAM RBC
Blood Product Expiration Date: 202512072359
Blood Product Expiration Date: 202512102359
Blood Product Expiration Date: 202512122359
Blood Product Expiration Date: 202512122359
Blood Product Expiration Date: 202512122359
Blood Product Expiration Date: 202512122359
ISSUE DATE / TIME: 202511111017
ISSUE DATE / TIME: 202511141048
ISSUE DATE / TIME: 202511141048
ISSUE DATE / TIME: 202511171256
ISSUE DATE / TIME: 202511171601
ISSUE DATE / TIME: 202511171727
Unit Type and Rh: 5100
Unit Type and Rh: 5100
Unit Type and Rh: 5100
Unit Type and Rh: 5100
Unit Type and Rh: 5100
Unit Type and Rh: 5100

## 2024-03-14 LAB — TYPE AND SCREEN
ABO/RH(D): O POS
Antibody Screen: NEGATIVE
Unit division: 0
Unit division: 0
Unit division: 0
Unit division: 0
Unit division: 0
Unit division: 0

## 2024-03-14 MED ORDER — OXYCODONE-ACETAMINOPHEN 5-325 MG PO TABS: 1.0000 | ORAL_TABLET | Freq: Four times a day (QID) | ORAL | 0 refills | Status: AC | PRN

## 2024-03-14 NOTE — Discharge Instructions (Signed)
   [facility]  Discharge Instructions   Open Abdominal Surgery  Please refer to the following instructions for your post-procedure care. Your surgeon or Physician Assistant will discuss any changes with you.  Activity  Avoid lifting more than eight pounds (a gallon of milk) until after your first post-operative visit. You are encouraged to walk as much as you can. You can slowly return to normal activities but must avoid strenuous activity and heavy lifting until your doctor tells you it's okay. Heavy lifting can hurt the incision and cause a hernia. Avoid activities such as vacuuming or swinging a golf club. It is normal to feel tired for several weeks after your surgery. Do not drive until your doctor gives the okay and you are no longer taking prescription pain medications. It is also normal to have difficulty with sleep habits, eating and bowl movements after surgery. These will go away with time.  Bathing/Showering  Shower daily after you go home. Do not soak in a bathtub, hot tub, or swim until the incision heals.  Incision Care  Shower every day. Clean your incision with mild soap and water. Pat the area dry with a clean towel. You do not need a bandage unless otherwise instructed. Do not apply any ointments or creams to your incision. You may have skin glue on your incision. Do not peel it off. It will come off on its own in about one week. If you have staples or sutures along your incision, they will be removed at your post op appointment.  Diet  Resume your normal diet. There are no special food restriction following this procedure. After your abdominal surgery, it's normal to feel full faster than usual and to not feel as hungry as you normally would. You will probably lose weight initially following your surgery. It's best to eat small, frequent meals over the course of the day. Call the office if you find that you are unable to eat even small  meals.   In order to heal from your surgery, it is CRITICAL to get adequate nutrition. Your body requires vitamins, minerals, and protein. Vegetables are the best source of vitamins and minerals.If you have pain, you may take over-the-counter pain reliever such as acetaminophen  (Tylenol ). If you were prescribed a stronger pain medication, please be aware these medication can cause nausea and constipation. Prevent nausea by taking the medication with a snack or meal. Avoid constipation by drinking plenty of fluids and eating foods with a high amount of fiber, such as fruits, vegetables and grains. Take 100mg  of the over-the-counter stool softener Colace twice a day as needed to help with constipation. A laxative, such as Milk of Magnesia, may be recommended for you at this time. Do not take a laxative unless your surgeon or P.A. tells you it's OK.   Do not take Tylenol  if you are taking stronger pain medications (such as Percocet).  Follow Up  Our office will schedule a follow up appointment 2-3 weeks after discharge.  Please call us  immediately for any of the following conditions    .     Severe or worsening pain in your legs or feet or in your abdomen back or chest. Increased pain, redness drainage (pus) from your incision site. Increased abdominal pain, bloating, nausea, vomiting, or persistent diarrhea. Fever of 101 degrees or higher. Swelling in your leg (s).  If you have any questions please call the office at [phone].

## 2024-03-14 NOTE — Progress Notes (Signed)
  Progress Note    03/14/2024 7:42 AM 4 Days Post-Op  Subjective:  no complaints this morning. Looking forward to going home   Vitals:   03/14/24 0344 03/14/24 0732  BP: 133/79 (!) 143/74  Pulse: 88 96  Resp: 18 18  Temp: 98.2 F (36.8 C) 97.7 F (36.5 C)  SpO2: 100% 97%   Physical Exam: Cardiac:  regular Lungs:  non labored Incisions:  midline abdominal incision is intact and well appearing Extremities: well perfused and warm Abdomen: flat, soft, non distended, expected tenderness Neurologic: alert and oriented   CBC    Component Value Date/Time   WBC 10.3 03/13/2024 0427   RBC 4.72 03/13/2024 0427   HGB 14.4 03/13/2024 0427   HCT 42.0 03/13/2024 0427   PLT 284 03/13/2024 0427   MCV 89.0 03/13/2024 0427   MCH 30.5 03/13/2024 0427   MCHC 34.3 03/13/2024 0427   RDW 12.4 03/13/2024 0427   LYMPHSABS 2.1 05/14/2015 1610   MONOABS 0.9 05/14/2015 1610   EOSABS 0.0 05/14/2015 1610   BASOSABS 0.1 05/14/2015 1610    BMET    Component Value Date/Time   NA 138 03/13/2024 0427   K 3.9 03/13/2024 0427   CL 98 03/13/2024 0427   CO2 27 03/13/2024 0427   GLUCOSE 86 03/13/2024 0427   BUN 14 03/13/2024 0427   CREATININE 1.17 03/13/2024 0427   CALCIUM 9.6 03/13/2024 0427   GFRNONAA >60 03/13/2024 0427   GFRAA >60 05/23/2019 0711    INR    Component Value Date/Time   INR 1.3 (H) 03/10/2024 1412    No intake or output data in the 24 hours ending 03/14/24 0742   Assessment/Plan:  23 y.o. male is s/p left renal vein transposition  4 Days Post-Op   Midline incision is clean, dry and intact without swelling or hematoma Pain well controlled VSS Tolerating diet Had BM this morning. Voiding without difficulty Okay to mobilize as tolerated  He is stable for discharge home today He will follow up with Dr. Serene in 2-3 weeks for incision check  Teretha Damme, PA-C [facility] [phone] 03/14/2024 7:42 AM

## 2024-03-15 NOTE — Discharge Summary (Addendum)
 Discharge Summary  Patient ID: Russell Walker [id number] 23 y.o. 2001-04-08  Admit date: 03/10/2024  Discharge date and time: 03/14/2024  9:24 AM   Admitting Physician: Gaile LELON New, MD   Discharge Physician: Dr. Gaile Malvina New  Admission Diagnoses: Nutcracker phenomenon of renal vein [I87.1] FH: abdominal aortic aneurysm repair [Z82.49]  Discharge Diagnoses: Nutcracker phenomenon of renal vein [I87.1] FH: abdominal aortic aneurysm repair [Z82.49]  Admission Condition: fair  Discharged Condition: good  Indication for Admission: This is a 23 year old gentleman with chronic history of left flank pain as well as hematuria.  CT scan shows a nutcracker compression of the left renal vein between the aorta and the superior mesenteric artery.  This was confirmed with venography.  We discussed proceeding with vein transposition   Hospital Course: [name] was admitted on 03/10/2024 and underwent  #1: Exploratory laparotomy #2: Transposition of the left renal vein to the inferior vena cava by Dr. New. He tolerated the surgery very well and was taken to the recovery room in stable condition. He continued to progress well and was taken to the 2H ICU in stable condition  Later in the evening his NG tube inadvertently was dislodged by the patient. No need to replace as patient did not have any nausea or vomiting.   POD#1. He did well overnight with no other issues. He remained stable for transfer out of the ICU to 4E flood. Abdominal pain as expected. Managed with PCA. Not passing any flatus. No bowel movement. Voiding without difficulty. Midline incision intact and well appearing. Advanced to sips and chips. Tolerated well. Evaluated by PT and OT and cleared for no further follow up. Tolerated mobilizing with mobility specialist as well.  Hemodynamically stable. Vitals all stable.   POD#2 pain about the same. Still needing PCA. Burping but still without flatus or bowel movement.  Very hungry. Started on clear fluid diet. Transitioned off of PCA to po pain medication. Encouraged increased mobilization to help with any ileus  POD#3 pain improved. Pain mostly with engaging his abdominal muscles when sitting up or sitting to standing. Tolerated clears. Diet advanced to regular. Passing gas. No bowel movement. Otherwise progressing well. Discontinued cardiac monitoring and continuous oxygen. Saturating okay on room air. Worked with PT again and doing well. No follow up recommended  POD#4 he remained stable for discharge home. Tolerating diet. Finally was able to have bowel movement. Mobilizing better and pain better controlled. Incision intact and well appearing. He has a lot of family support at  home and feels comfortable and ready for discharge. PDMP was reviewed and post operative pain medication was sent to his requested pharmacy. He will follow up with Dr. New in 2-3 weeks for incision check.   Consults: None  Treatments: IV hydration, antibiotics: Ancef , analgesia: acetaminophen , Dilaudid , and Oxycodone , Toradol , therapies: PT, OT, and RN, and surgery:  #1: Exploratory laparotomy #2: Transposition of the left renal vein to the inferior vena cava  Disposition: Discharge disposition: 01-Home or Self Care       Patient Instructions:  Allergies as of 03/14/2024   No Known Allergies      Medication List     TAKE these medications    oxyCODONE -acetaminophen  5-325 MG tablet Commonly known as: Percocet Take 1 tablet by mouth every 6 (six) hours as needed for severe pain (pain score 7-10).               Discharge Care Instructions  (From admission, onward)  Start     Ordered   03/14/24 0000  Discharge wound care:       Comments: Its okay to shower and wash your incision with mild soap and water, pat dry. Do not soak in bathtub, pool, etc   03/14/24 0756           Activity: activity as tolerated, no driving while on analgesics,  and no heavy lifting for 6 weeks Diet: regular diet Wound Care:Keep incision dry for 24 hours. You can then wash with mild soap and water, pat dry. Do not soak in bathtub, pool, etc  Follow-up with Dr. Serene in 2-3 weeks.  SignedBETHA Teretha Damme, PA-C 03/15/2024 4:47 PM [facility] Office: [phone]
